# Patient Record
Sex: Male | Born: 1985 | Race: Black or African American | Hispanic: No | Marital: Married | Smoking: Never smoker
Health system: Southern US, Community
[De-identification: ages and names within clinical notes are randomized; demographics above are authoritative.]

## PROBLEM LIST (undated history)

## (undated) HISTORY — PX: HERNIA REPAIR: SHX51

## (undated) HISTORY — PX: BACK SURGERY: SHX140

---

## 2017-10-01 ENCOUNTER — Encounter (HOSPITAL_COMMUNITY): Payer: Self-pay | Admitting: Emergency Medicine

## 2017-10-01 ENCOUNTER — Emergency Department (HOSPITAL_COMMUNITY)
Admission: EM | Admit: 2017-10-01 | Discharge: 2017-10-01 | Disposition: A | Discharge status: Home / Self Care | Payer: Non-veteran care | Attending: Emergency Medicine | Admitting: Emergency Medicine

## 2017-10-01 DIAGNOSIS — M5442 Lumbago with sciatica, left side: Secondary | ICD-10-CM | POA: Diagnosis not present

## 2017-10-01 DIAGNOSIS — Z79899 Other long term (current) drug therapy: Secondary | ICD-10-CM | POA: Insufficient documentation

## 2017-10-01 DIAGNOSIS — G8929 Other chronic pain: Secondary | ICD-10-CM | POA: Insufficient documentation

## 2017-10-01 DIAGNOSIS — M545 Low back pain: Secondary | ICD-10-CM | POA: Diagnosis present

## 2017-10-01 DIAGNOSIS — M5432 Sciatica, left side: Secondary | ICD-10-CM

## 2017-10-01 MED ORDER — PREDNISONE 20 MG PO TABS: 20.0000 mg | ORAL_TABLET | Freq: Every day | ORAL | 0 refills | Status: AC

## 2017-10-01 MED ORDER — TRAMADOL HCL 50 MG PO TABS: 50.0000 mg | ORAL_TABLET | Freq: Four times a day (QID) | ORAL | 0 refills | Status: AC | PRN

## 2017-10-01 MED ORDER — KETOROLAC TROMETHAMINE 30 MG/ML IJ SOLN
60.0000 mg | Freq: Once | INTRAMUSCULAR | Status: DC
Start: 2017-10-01 — End: 2017-10-01
  Filled 2017-10-01: qty 2

## 2017-10-01 MED ORDER — CYCLOBENZAPRINE HCL 10 MG PO TABS: 10.0000 mg | ORAL_TABLET | Freq: Two times a day (BID) | ORAL | 0 refills | Status: DC | PRN

## 2017-10-01 NOTE — Discharge Instructions (Signed)
You can take Tylenol or Ibuprofen as directed for pain. You can alternate Tylenol and Ibuprofen every 4 hours. If you take Tylenol at 1pm, then you can take Ibuprofen at 5pm. Then you can take Tylenol again at 9pm.   Take Flexeril as prescribed. This medication will make you drowsy so do not drive or drink alcohol when taking it.  Take prednisone as directed.  He can take tramadol for severe breakthrough pain.  Follow-up with your primary care doctor next 2 to 4 days for further evaluation.  Return to the Emergency Department immediately for any worsening back pain, neck pain, difficulty walking, numbness/weaknss of your arms or legs, urinary or bowel accidents, fever or any other worsening or concerning symptoms.

## 2017-10-01 NOTE — ED Provider Notes (Signed)
[facility] EMERGENCY DEPARTMENT Provider Note   CSN: [id number] Arrival date & time: 10/01/17  1421     History   Chief Complaint Chief Complaint  Patient presents with  . Back Pain    HPI Terry Dunn is a 32 y.o. male with PMH/o discectomy (2012) who presents for evaluation of lower back pain.  Patient reports a history of chronic lower back pain to his back issues.  Patient reports that over the last 3 days the pain has been worsened.  He denies any preceding trauma, injury, fall.  He does state that prior to onset of the symptoms, he moved and lifted several boxes which he thinks exacerbates his pain.  Patient states he is taking over-the-counter Tylenol ibuprofen with minimal improvement.  He was seen in urgent care 1 day after symptoms began.  At that time, they gave him a shot of steroids and Toradol.  Patient reports that he was discharged home and instructed on supportive at home care measures.  He states he has been taking Robaxin with minimal improvement in symptoms.  Comes the ED today because he is getting have pain.  Patient states that he has had some tingling sensation in his bilateral lower extremities.  He states that this is his baseline and is something that he always has.  No new numbness/weakness.  He has been able to ambulate without any difficulty.  He does report some shooting pain down the posterior aspect of his left lower leg which makes it painful to move. Denies fevers, weight loss, numbness/weakness of upper and lower extremities, bowel/bladder incontinence, saddle anesthesia, history of IVDA.   The history is provided by the patient.    History reviewed. No pertinent past medical history.  There are no active problems to display for this patient.   Past Surgical History:  Procedure Laterality Date  . BACK SURGERY          Home Medications    Prior to Admission medications   Medication Sig Start Date End Date Taking?  Authorizing Provider  gabapentin (NEURONTIN) 300 MG capsule Take 300 mg by mouth 4 (four) times daily.   Yes [provider]  methocarbamol (ROBAXIN) 500 MG tablet Take 500 mg by mouth 2 (two) times daily.   Yes [provider]  cyclobenzaprine (FLEXERIL) 10 MG tablet Take 1 tablet (10 mg total) by mouth 2 (two) times daily as needed for muscle spasms. 10/01/17   Maxwell Caul, PA-C  predniSONE (DELTASONE) 20 MG tablet Take 1 tablet (20 mg total) by mouth daily for 4 days. 10/01/17 10/05/17  Maxwell Caul, PA-C  traMADol (ULTRAM) 50 MG tablet Take 1 tablet (50 mg total) by mouth every 6 (six) hours as needed for up to 2 days. 10/01/17 10/03/17  Maxwell Caul, PA-C    Family History No family history on file.  Social History Social History   Tobacco Use  . Smoking status: Not on file  Substance Use Topics  . Alcohol use: Not on file  . Drug use: Not on file     Allergies   Peanut-containing drug products   Review of Systems Review of Systems  Gastrointestinal: Negative for vomiting.  Musculoskeletal: Positive for back pain.  Neurological: Negative for weakness and numbness.     Physical Exam Updated Vital Signs BP (!) 148/74 (BP Location: Left Arm)   Pulse 76   Temp 99 F (37.2 C) (Oral)   Resp 16   SpO2 100%  Physical Exam  Constitutional: He appears well-developed and well-nourished.  HENT:  Head: Normocephalic and atraumatic.  Eyes: Conjunctivae and EOM are normal. Right eye exhibits no discharge. Left eye exhibits no discharge. No scleral icterus.  Neck:  Full flexion/extension and lateral movement of neck fully intact. No bony midline tenderness. No deformities or crepitus.   Cardiovascular:  Pulses:      Dorsalis pedis pulses are 2+ on the right side, and 2+ on the left side.  Pulmonary/Chest: Effort normal.  Musculoskeletal:       Thoracic back: He exhibits no tenderness.       Back:  No midline T-spine tenderness.  Diffuse  muscular tenderness overlying the lumbar region that extends into the midline.  No deformities or crepitus noted.  More tenderness noted to the left paraspinal muscles that extends out into the gluteal region.  Neurological: He is alert.  Follows commands, Moves all extremities  5/5 strength to BUE and RLE, initially limited movement left lower extremity secondary to patient's pain in his leg but after encouraging patient, he was able to have full strength in the leg without any signs of weakness. Sensation intact throughout all major nerve distributions Positive straight leg raise on left side.  Skin: Skin is warm and dry.  Psychiatric: He has a normal mood and affect. His speech is normal and behavior is normal.  Nursing note and vitals reviewed.    ED Treatments / Results  Labs (all labs ordered are listed, but only abnormal results are displayed) Labs Reviewed - No data to display  EKG None  Radiology No results found.  Procedures Procedures (including critical care time)  Medications Ordered in ED Medications - No data to display   Initial Impression / Assessment and Plan / ED Course  I have reviewed the triage vital signs and the nursing notes.  Pertinent labs & imaging results that were available during my care of the patient were reviewed by me and considered in my medical decision making (see chart for details).     32 year old male who presents for evaluation of lower back pain x3 days.  Reports pain started after he was moving heavy boxes.  Does have a history of discectomy at L5/S1.  No fevers, incontinence, numbness/weakness of arms or legs, difficulty in bleeding.  No red flags. Patient is afebrile, non-toxic appearing, sitting comfortably on examination table. Vital signs reviewed and stable.  Exam, he has diffuse tenderness over the entire lumbar region, most notably in the left paraspinal muscle.  Additionally, he has positive straight leg raise test on the left.   Patient initially was reluctant to move his leg secondary to pain but after encouraging him, he had full strength of left lower extremity with any difficulty.  Suspect that this is mechanical back pain with sciatica.  History/physical exam is not concerning for cauda equina, spinal abscess, discitis.  No indication for acute emergent imaging here in the ED.  We will plan to treat symptomatically.  Patient reviewed on PMP.  No recent narcotic prescriptions.  Offered patient analgesics here in the department.  He was driving himself home and instructed him that I cannot give him anything sedated.  Patient refused Toradol here in the ED.  Patient instructed to follow-up with his primary care for further evaluation. Patient had ample opportunity for questions and discussion. All patient's questions were answered with full understanding. Strict return precautions discussed. Patient expresses understanding and agreement to plan.   Final Clinical Impressions(s) / ED Diagnoses  Final diagnoses:  Sciatica of left side  Acute left-sided low back pain, with sciatica presence unspecified    ED Discharge Orders         Ordered    predniSONE (DELTASONE) 20 MG tablet  Daily     10/01/17 1749    cyclobenzaprine (FLEXERIL) 10 MG tablet  2 times daily PRN     10/01/17 1749    traMADol (ULTRAM) 50 MG tablet  Every 6 hours PRN     10/01/17 1749           Maxwell CaulLayden, Lindsey A, PA-C 10/01/17 2319    Tegeler, Canary Brimhristopher J, MD 10/01/17 [phone]

## 2017-10-01 NOTE — ED Triage Notes (Signed)
Pt arrives to for lower back pain with tingling into both lower legs. Pt [facility] and received pain medication with no relief. Pt reports back surgery in 2015.pt is ambulatory in triage.

## 2018-01-07 ENCOUNTER — Encounter: Payer: Self-pay | Admitting: Pulmonary Disease

## 2018-01-07 ENCOUNTER — Ambulatory Visit (INDEPENDENT_AMBULATORY_CARE_PROVIDER_SITE_OTHER): Payer: No Typology Code available for payment source | Admitting: Pulmonary Disease

## 2018-01-07 VITALS — BP 118/78 | HR 94 | Ht 72.0 in | Wt 252.0 lb

## 2018-01-07 DIAGNOSIS — G4733 Obstructive sleep apnea (adult) (pediatric): Secondary | ICD-10-CM

## 2018-01-07 NOTE — Progress Notes (Addendum)
Subjective:    Patient ID: Terry Dunn, male    DOB: 09/02/1985, 32 y.o.   MRN: [id number]  Chief complaint : patient with mild obstructive sleep apnea   Patient with a history of mild obstructive sleep apnea diagnosed on recent study He does have significant daytime symptoms-daytime sleepiness  Difficulty with sleep onset  He usually goes to bed between 11 and 2 AM, takes him about 30 minutes to 45 minutes to fall asleep, wakes up about 3-4 times during the night Final awakening about 630 He has gained some weight recently-works out a lot more, his weight is going up but is body fat composition is on not better He has at least 3 sleep studies The first 1 did reveal restless legs Second one was inconclusive The most recent one an in lab study did reveal mild obstructive sleep apnea  Denies a dry mouth in the morning, occasionally does have choking sensations, significant snoring history He has chronic back pain which contributed to the onset of his insomnia  His dad snores actively-not diagnosed with sleep apnea    Review of Systems  Constitutional: Negative for fever and unexpected weight change.  HENT: Negative for congestion, dental problem, ear pain, nosebleeds, postnasal drip, rhinorrhea, sinus pressure, sneezing, sore throat and trouble swallowing.   Eyes: Positive for redness and itching.  Respiratory: Negative for cough, chest tightness, shortness of breath and wheezing.   Cardiovascular: Negative for palpitations and leg swelling.  Gastrointestinal: Negative for nausea and vomiting.  Genitourinary: Negative for dysuria.  Musculoskeletal: Negative for joint swelling.  Skin: Negative for rash.  Allergic/Immunologic: Positive for environmental allergies. Negative for food allergies and immunocompromised state.  Neurological: Positive for headaches.  Hematological: Does not bruise/bleed easily.  Psychiatric/Behavioral: Negative for dysphoric mood. The patient is  nervous/anxious.    No past medical history on file. Social History   Socioeconomic History  . Marital status: Married    Spouse name: Not on file  . Number of children: Not on file  . Years of education: Not on file  . Highest education level: Not on file  Occupational History  . Not on file  Social Needs  . Financial resource strain: Not on file  . Food insecurity:    Worry: Not on file    Inability: Not on file  . Transportation needs:    Medical: Not on file    Non-medical: Not on file  Tobacco Use  . Smoking status: Not on file  Substance and Sexual Activity  . Alcohol use: Not on file  . Drug use: Not on file  . Sexual activity: Not on file  Lifestyle  . Physical activity:    Days per week: Not on file    Minutes per session: Not on file  . Stress: Not on file  Relationships  . Social connections:    Talks on phone: Not on file    Gets together: Not on file    Attends religious service: Not on file    Active member of club or organization: Not on file    Attends meetings of clubs or organizations: Not on file    Relationship status: Not on file  . Intimate partner violence:    Fear of current or ex partner: Not on file    Emotionally abused: Not on file    Physically abused: Not on file    Forced sexual activity: Not on file  Other Topics Concern  . Not on file  Social History  Narrative  . Not on file   No family history on file.  Vitals:   01/07/18 1433  BP: 118/78  Pulse: 94  SpO2: 98%       Objective:   Physical Exam  Constitutional: He appears well-developed and well-nourished. No distress.  HENT:  Head: Normocephalic and atraumatic.  Mallampati 2  Eyes: Pupils are equal, round, and reactive to light. EOM are normal. Right eye exhibits no discharge. Left eye exhibits no discharge.  Neck: Normal range of motion. Neck supple. No tracheal deviation present. No thyromegaly present.  Cardiovascular: Normal rate and regular rhythm.    Pulmonary/Chest: Effort normal and breath sounds normal. No stridor. No respiratory distress.  Abdominal: Soft. Bowel sounds are normal.  Musculoskeletal: Normal range of motion.     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  Sleep onset insomnia  Daytime sleepiness  Plan:  Patient will benefit from treatment with CPAP therapy  Pathophysiology of sleep disordered breathing discussed with the patient  Treatment options discussed with the patient  Will refer to DME company We will set the patient up with an auto titrating CPAP pressure settings of 5-15  Following initiation of treatment, if he still having significant problems with insomnia, may benefit from treatment of his insomnia with a short to medium acting hypnotic  We will continue to follow  We will see him back in the office in about 3 months Encouraged to call with any significant symptoms  Note was reviewed by AA Olalere.  MD

## 2018-01-07 NOTE — Patient Instructions (Signed)
Patient with mild obstructive sleep apnea with significant daytime sleepiness Sleep onset insomnia  Recent polysomnogram with mild obstructive sleep apnea History of restless legs  We will set you up with a CPAP  DME referral for auto titrating CPAP 5-15  I will see you back in the office in about 3 months  Call with significant concerns

## 2018-02-19 ENCOUNTER — Encounter (HOSPITAL_COMMUNITY): Payer: Self-pay | Admitting: Emergency Medicine

## 2018-02-19 ENCOUNTER — Emergency Department (HOSPITAL_COMMUNITY): Payer: Non-veteran care

## 2018-02-19 ENCOUNTER — Other Ambulatory Visit: Payer: Self-pay

## 2018-02-19 ENCOUNTER — Emergency Department (HOSPITAL_COMMUNITY)
Admission: EM | Admit: 2018-02-19 | Discharge: 2018-02-19 | Disposition: A | Discharge status: Home / Self Care | Payer: Non-veteran care | Attending: Emergency Medicine | Admitting: Emergency Medicine

## 2018-02-19 DIAGNOSIS — M5432 Sciatica, left side: Secondary | ICD-10-CM | POA: Diagnosis not present

## 2018-02-19 DIAGNOSIS — M549 Dorsalgia, unspecified: Secondary | ICD-10-CM | POA: Diagnosis present

## 2018-02-19 DIAGNOSIS — Z79899 Other long term (current) drug therapy: Secondary | ICD-10-CM | POA: Insufficient documentation

## 2018-02-19 LAB — BASIC METABOLIC PANEL
ANION GAP: 7 (ref 5–15)
BUN: 10 mg/dL (ref 6–20)
CHLORIDE: 106 mmol/L (ref 98–111)
CO2: 27 mmol/L (ref 22–32)
Calcium: 9.6 mg/dL (ref 8.9–10.3)
Creatinine, Ser: 1.16 mg/dL (ref 0.61–1.24)
GFR calc non Af Amer: >60 mL/min (ref >60)
Glucose, Bld: 100 mg/dL — ABNORMAL HIGH (ref 70–99)
POTASSIUM: 3.8 mmol/L (ref 3.5–5.1)
Sodium: 140 mmol/L (ref 135–145)

## 2018-02-19 LAB — CBC WITH DIFFERENTIAL/PLATELET
ABS IMMATURE GRANULOCYTES: 0.03 10*3/uL (ref 0.00–0.07)
BASOS ABS: 0 10*3/uL (ref 0.0–0.1)
Basophils Relative: 1 %
EOS PCT: 5 %
Eosinophils Absolute: 0.3 10*3/uL (ref 0.0–0.5)
HEMATOCRIT: 45.5 % (ref 39.0–52.0)
HEMOGLOBIN: 15.3 g/dL (ref 13.0–17.0)
Immature Granulocytes: 1 %
LYMPHS ABS: 2.6 10*3/uL (ref 0.7–4.0)
LYMPHS PCT: 44 %
MCH: 29.1 pg (ref 26.0–34.0)
MCHC: 33.6 g/dL (ref 30.0–36.0)
MCV: 86.7 fL (ref 80.0–100.0)
Monocytes Absolute: 0.5 10*3/uL (ref 0.1–1.0)
Monocytes Relative: 9 %
NEUTROS ABS: 2.3 10*3/uL (ref 1.7–7.7)
NRBC: 0 % (ref 0.0–0.2)
Neutrophils Relative %: 40 %
Platelets: 279 10*3/uL (ref 150–400)
RBC: 5.25 MIL/uL (ref 4.22–5.81)
RDW: 12.4 % (ref 11.5–15.5)
WBC: 5.8 10*3/uL (ref 4.0–10.5)

## 2018-02-19 MED ORDER — CYCLOBENZAPRINE HCL 10 MG PO TABS: 10.0000 mg | ORAL_TABLET | Freq: Two times a day (BID) | ORAL | 0 refills | Status: DC | PRN

## 2018-02-19 MED ORDER — TRAMADOL HCL 50 MG PO TABS: 50.0000 mg | ORAL_TABLET | Freq: Four times a day (QID) | ORAL | 0 refills | Status: DC | PRN

## 2018-02-19 MED ORDER — KETOROLAC TROMETHAMINE 30 MG/ML IJ SOLN
30.0000 mg | Freq: Once | INTRAMUSCULAR | Status: AC
  Administered 2018-02-19: 30 mg via INTRAVENOUS
  Filled 2018-02-19: qty 1

## 2018-02-19 MED ORDER — MORPHINE SULFATE (PF) 4 MG/ML IV SOLN
4.0000 mg | Freq: Once | INTRAVENOUS | Status: AC
  Administered 2018-02-19: 4 mg via INTRAVENOUS
  Filled 2018-02-19: qty 1

## 2018-02-19 MED ORDER — DIAZEPAM 5 MG/ML IJ SOLN
5.0000 mg | Freq: Once | INTRAMUSCULAR | Status: AC
  Administered 2018-02-19: 5 mg via INTRAVENOUS
  Filled 2018-02-19: qty 2

## 2018-02-19 NOTE — Discharge Instructions (Signed)
You have a pinched nerve   Take motrin for pain   Take flexeril for muscle spasms.   Take tramadol for severe pain   See spine doctor for follow up   Return to ER if you have worse back pain, leg pain, numbness, weakness

## 2018-02-19 NOTE — ED Provider Notes (Signed)
[facility] EMERGENCY DEPARTMENT Provider Note   CSN: [id number] Arrival date & time: 02/19/18  1907     History   Chief Complaint Chief Complaint  Patient presents with  . Sciatica    HPI Montague Tylutki is a 33 y.o. male history of sciatica here presenting with worsening back pain, left leg pain.  Patient states that he had a previous diagnosis of sciatica but had no imaging studies.  He has intermittent back pain the last several months.  Patient did come to the ED about 6 months ago and was diagnosed sciatica and finished a course of steroids.  States that steroids has minimally helped with symptoms.  He states that today, he had episode of back spasm is worse on the left side.  States that it radiates down his left leg. Denies any numbness or weakness or incontinence.  Denies any falls or trauma or injury.  The history is provided by the patient.    History reviewed. No pertinent past medical history.  There are no active problems to display for this patient.   Past Surgical History:  Procedure Laterality Date  . BACK SURGERY          Home Medications    Prior to Admission medications   Medication Sig Start Date End Date Taking? Authorizing Provider  gabapentin (NEURONTIN) 300 MG capsule Take 300 mg by mouth 4 (four) times daily.   Yes [provider]  methocarbamol (ROBAXIN) 500 MG tablet Take 500 mg by mouth 2 (two) times daily.   Yes [provider]  cyclobenzaprine (FLEXERIL) 10 MG tablet Take 1 tablet (10 mg total) by mouth 2 (two) times daily as needed for muscle spasms. Patient not taking: Reported on 02/19/2018 10/01/17   Maxwell Caul, PA-C    Family History No family history on file.  Social History Social History   Tobacco Use  . Smoking status: Never Smoker  . Smokeless tobacco: Never Used  Substance Use Topics  . Alcohol use: Never    Frequency: Never  . Drug use: Never     Allergies   Peanut-containing  drug products   Review of Systems Review of Systems  Musculoskeletal: Positive for back pain.  All other systems reviewed and are negative.    Physical Exam Updated Vital Signs BP (!) 129/107   Pulse 85   Temp 98.3 F (36.8 C) (Oral)   Resp 17   Ht 6' (1.829 m)   Wt 112.9 kg   SpO2 99%   BMI 33.77 kg/m   Physical Exam Vitals signs and nursing note reviewed.  Constitutional:      Comments: Uncomfortable   HENT:     Head: Normocephalic.     Nose: Nose normal.     Mouth/Throat:     Mouth: Mucous membranes are moist.  Eyes:     Extraocular Movements: Extraocular movements intact.     Pupils: Pupils are equal, round, and reactive to light.  Neck:     Musculoskeletal: Normal range of motion.  Cardiovascular:     Rate and Rhythm: Normal rate.  Pulmonary:     Effort: Pulmonary effort is normal.     Breath sounds: Normal breath sounds.  Abdominal:     General: Abdomen is flat.     Palpations: Abdomen is soft.  Musculoskeletal:     Comments: + L paralumbar tenderness. + straight leg raise L side, no saddle anesthesia. Nl reflexes bilaterally   Skin:    General: Skin  is warm.  Neurological:     General: No focal deficit present.     Mental Status: He is alert.  Psychiatric:        Mood and Affect: Mood normal.      ED Treatments / Results  Labs (all labs ordered are listed, but only abnormal results are displayed) Labs Reviewed  BASIC METABOLIC PANEL - Abnormal; Notable for the following components:      Result Value   Glucose, Bld 100 (*)    All other components within normal limits  CBC WITH DIFFERENTIAL/PLATELET    EKG None  Radiology Ct Lumbar Spine Wo Contrast  Result Date: 02/19/2018 CLINICAL DATA:  Back pain and bilateral lower extremity numbness. EXAM: CT LUMBAR SPINE WITHOUT CONTRAST TECHNIQUE: Multidetector CT imaging of the lumbar spine was performed without intravenous contrast administration. Multiplanar CT image reconstructions were also  generated. COMPARISON:  None. FINDINGS: Segmentation: 5 lumbar type vertebrae. Alignment: Normal. Vertebrae: No acute fracture or focal pathologic process. L5-S1 spacer. Paraspinal and other soft tissues: Negative. Disc levels: The disc spaces above L5 are normal. L5-S1: Postsurgical change with intervertebral spacer. There is moderate right and mild left neural foraminal stenosis due to endplate spurring. Spinal canal is largely obscured by streak artifact. IMPRESSION: 1. No acute fracture or static subluxation of the lumbar spine. 2. 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 Electronically Signed   By: Deatra Robinson M.D.   On: 02/19/2018 22:28    Procedures Procedures (including critical care time)  Medications Ordered in ED Medications  morphine 4 MG/ML injection 4 mg (4 mg Intravenous Given 02/19/18 2111)  diazepam (VALIUM) injection 5 mg (5 mg Intravenous Given 02/19/18 2125)  ketorolac (TORADOL) 30 MG/ML injection 30 mg (30 mg Intravenous Given 02/19/18 2118)     Initial Impression / Assessment and Plan / ED Course  I have reviewed the triage vital signs and the nursing notes.  Pertinent labs & imaging results that were available during my care of the patient were reviewed by me and considered in my medical decision making (see chart for details).    Meredith Hoese is a 33 y.o. male here with back pain. Likely sciatica from disc protrusion. States that steroids hasn't help much in the past and has no imaging studies for this. Will get CT lumbar spine. No need for MRI as he is neurovascularly intact. Will give pain meds, muscle relaxants, NSAIDS.   11:19 PM CT showed L5-S1 stenosis. Pain improved. Will dc home with tramadol, flexeril. Will refer to spine for follow up. He states that steroids hasn't help in the past so will hold off on steroids.   Final Clinical Impressions(s) / ED Diagnoses   Final diagnoses:  None    ED Discharge Orders     None       Charlynne Pander, MD 02/19/18 2320

## 2018-02-19 NOTE — ED Triage Notes (Signed)
Pt reports left lower back pain and bilateral leg numbness that started this morning after having a back spasm. Pt reports feels like sciatica. Pt ambulatory, reports painful, tingling sensation when walking.

## 2018-04-02 ENCOUNTER — Ambulatory Visit (INDEPENDENT_AMBULATORY_CARE_PROVIDER_SITE_OTHER): Payer: No Typology Code available for payment source | Admitting: Pulmonary Disease

## 2018-04-02 ENCOUNTER — Encounter: Payer: Self-pay | Admitting: Pulmonary Disease

## 2018-04-02 VITALS — BP 120/82 | HR 76 | Ht 72.0 in | Wt 247.8 lb

## 2018-04-02 DIAGNOSIS — G4733 Obstructive sleep apnea (adult) (pediatric): Secondary | ICD-10-CM

## 2018-04-02 DIAGNOSIS — Z9989 Dependence on other enabling machines and devices: Secondary | ICD-10-CM | POA: Diagnosis not present

## 2018-04-02 NOTE — Patient Instructions (Signed)
Obstructive sleep apnea  Encourage you to give CPAP another effort, try and narrow down what may be causing the intolerance  An oral device may be an option if you still cannot tolerate CPAP despite trying to get used to it  I will see you back in the office in about 6 weeks

## 2018-04-02 NOTE — Progress Notes (Signed)
Subjective:    Patient ID: Terry Dunn, male    DOB: 28-Feb-1985, 33 y.o.   MRN: [id number]  Chief complaint : patient with mild obstructive sleep apnea   Patient with a history of mild obstructive sleep apnea diagnosed on recent study He does have significant daytime symptoms-daytime sleepiness  Difficulty with sleep onset Has been using melatonin which is helping  History He usually goes to bed between 11 and 2 AM, takes him about 30 minutes to 45 minutes to fall asleep, wakes up about 3-4 times during the night Final awakening about 630 He has gained some weight recently-works out a lot more, his weight is going up but is body fat composition is on not better He has at least 3 sleep studies The first 1 did reveal restless legs Second one was inconclusive The most recent one an in lab study did reveal mild obstructive sleep apnea  Denies a dry mouth in the morning, occasionally does have choking sensations, significant snoring history He has chronic back pain which contributed to the onset of his insomnia  His dad snores actively-not diagnosed with sleep apnea    Review of Systems  Constitutional: Negative for fever and unexpected weight change.  HENT: Negative for congestion, dental problem, ear pain, nosebleeds, postnasal drip, rhinorrhea, sinus pressure, sneezing, sore throat and trouble swallowing.   Eyes: Negative.   Respiratory: Negative for cough, chest tightness, shortness of breath and wheezing.   Cardiovascular: Negative for palpitations and leg swelling.  Gastrointestinal: Negative for nausea and vomiting.  Genitourinary: Negative.   Skin: Negative for rash.  Allergic/Immunologic: Negative.   Neurological: Negative.   Hematological: Negative.   Psychiatric/Behavioral: Negative.    No past medical history on file. Social History   Socioeconomic History  . Marital status: Married    Spouse name: Not on file  . Number of children: Not on file  . Years of  education: Not on file  . Highest education level: Not on file  Occupational History  . Not on file  Social Needs  . Financial resource strain: Not on file  . Food insecurity:    Worry: Not on file    Inability: Not on file  . Transportation needs:    Medical: Not on file    Non-medical: Not on file  Tobacco Use  . Smoking status: Never Smoker  . Smokeless tobacco: Never Used  Substance and Sexual Activity  . Alcohol use: Never    Frequency: Never  . Drug use: Never  . Sexual activity: Not on file  Lifestyle  . Physical activity:    Days per week: Not on file    Minutes per session: Not on file  . Stress: Not on file  Relationships  . Social connections:    Talks on phone: Not on file    Gets together: Not on file    Attends religious service: Not on file    Active member of club or organization: Not on file    Attends meetings of clubs or organizations: Not on file    Relationship status: Not on file  . Intimate partner violence:    Fear of current or ex partner: Not on file    Emotionally abused: Not on file    Physically abused: Not on file    Forced sexual activity: Not on file  Other Topics Concern  . Not on file  Social History Narrative  . Not on file   No family history on file.  There  were no vitals filed for this visit.     Objective:   Physical Exam Constitutional:      General: He is not in acute distress.    Appearance: He is well-developed. He is not ill-appearing.  HENT:     Head: Normocephalic and atraumatic.  Eyes:     General:        Right eye: No discharge.        Left eye: No discharge.     Pupils: Pupils are equal, round, and reactive to light.  Neck:     Musculoskeletal: Normal range of motion and neck supple.     Thyroid: No thyromegaly.     Trachea: No tracheal deviation.  Cardiovascular:     Rate and Rhythm: Normal rate and regular rhythm.  Pulmonary:     Effort: Pulmonary effort is normal. No respiratory distress.      Breath sounds: Normal breath sounds. No stridor.  Abdominal:     General: Bowel sounds are normal.     Palpations: Abdomen is soft.      Most recent sleep study did reveal an AHI of 10 Started on auto titrating CPAP Results of the Epworth flowsheet 01/07/2018  Sitting and reading 2  Watching TV 1  Sitting, inactive in a public place (e.g. 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 -Melatonin seems to be helping the insomnia  Daytime sleepiness   Plan:  Patient will benefit from treatment with CPAP therapy -Did discuss problems these he is having with CPAP at present -He feels the pressure feels too much when he puts the mask on, sometimes feel he is doing a lot of work to breathe with the mask on  Pathophysiology of sleep disordered breathing discussed with the patient  Treatment options discussed with the patient -The possibility of intolerance of CPAP discussed -May require evaluation by a dentist for an oral device  If he is able to tolerate CPAP a little bit better He may benefit from a hypnotic agent to help with sleep onset and sleep maintenance insomnia  We will see him back in the office in about 6 weeks Encouraged to call with any significant symptoms

## 2018-04-08 ENCOUNTER — Encounter (HOSPITAL_COMMUNITY): Payer: Self-pay

## 2018-04-08 ENCOUNTER — Emergency Department (HOSPITAL_COMMUNITY)
Admission: EM | Admit: 2018-04-08 | Discharge: 2018-04-09 | Disposition: A | Discharge status: Home / Self Care | Payer: Non-veteran care | Attending: Emergency Medicine | Admitting: Emergency Medicine

## 2018-04-08 ENCOUNTER — Other Ambulatory Visit: Payer: Self-pay

## 2018-04-08 DIAGNOSIS — M549 Dorsalgia, unspecified: Secondary | ICD-10-CM | POA: Diagnosis not present

## 2018-04-08 DIAGNOSIS — Z5321 Procedure and treatment not carried out due to patient leaving prior to being seen by health care provider: Secondary | ICD-10-CM | POA: Diagnosis not present

## 2018-04-08 NOTE — ED Triage Notes (Signed)
Pt here with hx disc disease and stood up today and hard sharp right sided back pain.  A&Ox4. No other symptoms.

## 2018-04-09 NOTE — ED Notes (Signed)
Pt called for, no answer

## 2018-04-09 NOTE — ED Notes (Signed)
Called for room  No answer  

## 2018-05-14 ENCOUNTER — Ambulatory Visit: Payer: No Typology Code available for payment source | Admitting: Nurse Practitioner

## 2018-05-14 ENCOUNTER — Ambulatory Visit: Payer: No Typology Code available for payment source | Admitting: Pulmonary Disease

## 2018-09-01 ENCOUNTER — Other Ambulatory Visit: Payer: Self-pay

## 2018-09-01 ENCOUNTER — Ambulatory Visit: Payer: No Typology Code available for payment source | Admitting: Nurse Practitioner

## 2018-09-01 NOTE — Progress Notes (Deleted)
 @  Patient ID: Terry Dunn, male    DOB: 09-15-1985, 33 y.o.   MRN: [id number]  No chief complaint on file.   Referring provider: [facility]  HPI  33 year old male with OSA who is followed by Dr. Ander Slade  Tests:  Most recent sleep study showed AHI 10  Results of the Epworth flowsheet 01/07/2018  Sitting and reading 2  Watching TV 1  Sitting, inactive in a public place (e.g. a theatre or a meeting) 1  As a passenger in a car for an hour without a break 1  Lying down to rest in the afternoon when circumstances permit 2  Sitting and talking to someone 0  Sitting quietly after a lunch without alcohol 1  In a car, while stopped for a few minutes in traffic 0  Total score 8    OV 09/01/18 - follow up    Allergies  Allergen Reactions  . Peanut-Containing Drug Products Anaphylaxis    ALL TREE NUTS     There is no immunization history on file for this patient.  No past medical history on file.  Tobacco History: Social History   Tobacco Use  Smoking Status Never Smoker  Smokeless Tobacco Never Used   Counseling given: Not Answered   Outpatient Encounter Medications as of 09/01/2018  Medication Sig  . baclofen (LIORESAL) 20 MG tablet Take 20 mg by mouth 3 (three) times daily.  Marland Kitchen gabapentin (NEURONTIN) 300 MG capsule Take 300 mg by mouth 4 (four) times daily.  . methocarbamol (ROBAXIN) 500 MG tablet Take 500 mg by mouth 2 (two) times daily.  . traMADol (ULTRAM) 50 MG tablet Take 1 tablet (50 mg total) by mouth every 6 (six) hours as needed.   No facility-administered encounter medications on file as of 09/01/2018.      Review of Systems  Review of Systems     Physical Exam  There were no vitals taken for this visit.  Wt Readings from Last 5 Encounters:  04/02/18 247 lb 12.8 oz (112.4 kg)  02/19/18 249 lb (112.9 kg)  01/07/18 252 lb (114.3 kg)     Physical Exam   Lab Results:  CBC    Component Value Date/Time   WBC 5.8  02/19/2018 2113   RBC 5.25 02/19/2018 2113   HGB 15.3 02/19/2018 2113   HCT 45.5 02/19/2018 2113   PLT 279 02/19/2018 2113   MCV 86.7 02/19/2018 2113   MCH 29.1 02/19/2018 2113   MCHC 33.6 02/19/2018 2113   RDW 12.4 02/19/2018 2113   LYMPHSABS 2.6 02/19/2018 2113   MONOABS 0.5 02/19/2018 2113   EOSABS 0.3 02/19/2018 2113   BASOSABS 0.0 02/19/2018 2113    BMET    Component Value Date/Time   NA 140 02/19/2018 2113   K 3.8 02/19/2018 2113   CL 106 02/19/2018 2113   CO2 27 02/19/2018 2113   GLUCOSE 100 (H) 02/19/2018 2113   BUN 10 02/19/2018 2113   CREATININE 1.16 02/19/2018 2113   CALCIUM 9.6 02/19/2018 2113   GFRNONAA >60 02/19/2018 2113   GFRAA >60 02/19/2018 2113    BNP No results found for: BNP  ProBNP No results found for: PROBNP  Imaging: No results found.   Assessment & Plan:   No problem-specific Assessment & Plan notes found for this encounter.     Fenton Foy, NP 09/01/2018

## 2019-03-22 ENCOUNTER — Encounter (HOSPITAL_COMMUNITY): Payer: Self-pay | Admitting: Radiology

## 2019-03-22 ENCOUNTER — Emergency Department (HOSPITAL_COMMUNITY)
Admission: EM | Admit: 2019-03-22 | Discharge: 2019-03-22 | Disposition: A | Discharge status: Home / Self Care | Payer: No Typology Code available for payment source | Attending: Emergency Medicine | Admitting: Emergency Medicine

## 2019-03-22 ENCOUNTER — Emergency Department (HOSPITAL_COMMUNITY): Payer: No Typology Code available for payment source

## 2019-03-22 ENCOUNTER — Other Ambulatory Visit: Payer: Self-pay

## 2019-03-22 DIAGNOSIS — R197 Diarrhea, unspecified: Secondary | ICD-10-CM | POA: Diagnosis not present

## 2019-03-22 DIAGNOSIS — Z79899 Other long term (current) drug therapy: Secondary | ICD-10-CM | POA: Insufficient documentation

## 2019-03-22 DIAGNOSIS — R1033 Periumbilical pain: Secondary | ICD-10-CM | POA: Insufficient documentation

## 2019-03-22 DIAGNOSIS — Z91010 Allergy to peanuts: Secondary | ICD-10-CM | POA: Diagnosis not present

## 2019-03-22 DIAGNOSIS — R109 Unspecified abdominal pain: Secondary | ICD-10-CM

## 2019-03-22 DIAGNOSIS — R1030 Lower abdominal pain, unspecified: Secondary | ICD-10-CM | POA: Insufficient documentation

## 2019-03-22 LAB — COMPREHENSIVE METABOLIC PANEL
ALT: 33 U/L (ref 0–44)
AST: 23 U/L (ref 15–41)
Albumin: 4 g/dL (ref 3.5–5.0)
Alkaline Phosphatase: 37 U/L — ABNORMAL LOW (ref 38–126)
Anion gap: 9 (ref 5–15)
BUN: 11 mg/dL (ref 6–20)
CO2: 25 mmol/L (ref 22–32)
Calcium: 9.3 mg/dL (ref 8.9–10.3)
Chloride: 105 mmol/L (ref 98–111)
Creatinine, Ser: 1.09 mg/dL (ref 0.61–1.24)
GFR calc Af Amer: >60 mL/min (ref >60)
GFR calc non Af Amer: >60 mL/min (ref >60)
Glucose, Bld: 77 mg/dL (ref 70–99)
Potassium: 3.5 mmol/L (ref 3.5–5.1)
Sodium: 139 mmol/L (ref 135–145)
Total Bilirubin: 1.9 mg/dL — ABNORMAL HIGH (ref 0.3–1.2)
Total Protein: 6.8 g/dL (ref 6.5–8.1)

## 2019-03-22 LAB — CBC WITH DIFFERENTIAL/PLATELET
Abs Immature Granulocytes: 0.03 10*3/uL (ref 0.00–0.07)
Basophils Absolute: 0.1 10*3/uL (ref 0.0–0.1)
Basophils Relative: 1 %
Eosinophils Absolute: 0.3 10*3/uL (ref 0.0–0.5)
Eosinophils Relative: 4 %
HCT: 46.1 % (ref 39.0–52.0)
Hemoglobin: 15.7 g/dL (ref 13.0–17.0)
Immature Granulocytes: 0 %
Lymphocytes Relative: 50 %
Lymphs Abs: 3.5 10*3/uL (ref 0.7–4.0)
MCH: 30.7 pg (ref 26.0–34.0)
MCHC: 34.1 g/dL (ref 30.0–36.0)
MCV: 90.2 fL (ref 80.0–100.0)
Monocytes Absolute: 0.6 10*3/uL (ref 0.1–1.0)
Monocytes Relative: 9 %
Neutro Abs: 2.5 10*3/uL (ref 1.7–7.7)
Neutrophils Relative %: 36 %
Platelets: 300 10*3/uL (ref 150–400)
RBC: 5.11 MIL/uL (ref 4.22–5.81)
RDW: 13.4 % (ref 11.5–15.5)
WBC: 6.9 10*3/uL (ref 4.0–10.5)
nRBC: 0 % (ref 0.0–0.2)

## 2019-03-22 LAB — URINALYSIS, ROUTINE W REFLEX MICROSCOPIC
Bilirubin Urine: NEGATIVE
Glucose, UA: NEGATIVE mg/dL
Hgb urine dipstick: NEGATIVE
Ketones, ur: NEGATIVE mg/dL
Leukocytes,Ua: NEGATIVE
Nitrite: NEGATIVE
Protein, ur: NEGATIVE mg/dL
Specific Gravity, Urine: 1.01 (ref 1.005–1.030)
pH: 7 (ref 5.0–8.0)

## 2019-03-22 LAB — LIPASE, BLOOD: Lipase: 32 U/L (ref 11–51)

## 2019-03-22 MED ORDER — IOHEXOL 300 MG/ML  SOLN
100.0000 mL | Freq: Once | INTRAMUSCULAR | Status: AC | PRN
  Administered 2019-03-22: 100 mL via INTRAVENOUS

## 2019-03-22 MED ORDER — CIPROFLOXACIN HCL 500 MG PO TABS: 500.0000 mg | ORAL_TABLET | Freq: Two times a day (BID) | ORAL | 0 refills | Status: AC

## 2019-03-22 MED ORDER — MORPHINE SULFATE (PF) 4 MG/ML IV SOLN
4.0000 mg | Freq: Once | INTRAVENOUS | Status: AC
Start: 2019-03-22 — End: 2019-03-22
  Administered 2019-03-22: 4 mg via INTRAVENOUS
  Filled 2019-03-22: qty 1

## 2019-03-22 MED ORDER — SODIUM CHLORIDE 0.9 % IV BOLUS
1000.0000 mL | Freq: Once | INTRAVENOUS | Status: AC
  Administered 2019-03-22: 1000 mL via INTRAVENOUS

## 2019-03-22 MED ORDER — METRONIDAZOLE 500 MG PO TABS: 500.0000 mg | ORAL_TABLET | Freq: Three times a day (TID) | ORAL | 0 refills | Status: AC

## 2019-03-22 MED ORDER — MORPHINE SULFATE (PF) 4 MG/ML IV SOLN
4.0000 mg | Freq: Once | INTRAVENOUS | Status: AC
  Administered 2019-03-22: 14:00:00 4 mg via INTRAVENOUS
  Filled 2019-03-22: qty 1

## 2019-03-22 MED ORDER — ONDANSETRON HCL 4 MG/2ML IJ SOLN
4.0000 mg | Freq: Once | INTRAMUSCULAR | Status: AC
  Administered 2019-03-22: 4 mg via INTRAVENOUS
  Filled 2019-03-22: qty 2

## 2019-03-22 NOTE — ED Provider Notes (Signed)
[facility] DEPART[facility] Provider Note   CSN: [id number] Arrival date & time: 03/22/19  1256     History Chief Complaint  Patient presents with   Abdominal Pain    Terry Dunn is a 34 y.o. male otherwise healthy no daily medication delays presents today for abdominal pain that began at 4 PM yesterday.  He was making chicken wings when his pain began, he denies eating prior to onset of pain.  He describes a cramping sharp pain periumbilical moderate intensity constant since onset, pain has begun to radiate to his lower abdomen and he points to his right lower abdomen.  He attempted Pepto-Bismol x1 without relief of his pain.  Pain is worsened with palpation of the abdomen.  Associated symptom of 3 episodes of nonbloody, nonwatery diarrhea, normal color per patient.  He denies fever/chills, nausea/vomiting, headache, chest pain/shortness of breath, cough or recent illnesses, he denies any dysuria/hematuria or testicular pain/swelling patient denies any other concerns today.  HPI     No past medical history on file.  There are no problems to display for this patient.   Past Surgical History:  Procedure Laterality Date   BACK SURGERY         No family history on file.  Social History   Tobacco Use   Smoking status: Never Smoker   Smokeless tobacco: Never Used  Substance Use Topics   Alcohol use: Never   Drug use: Never    Home Medications Prior to Admission medications   Medication Sig Start Date End Date Taking? Authorizing Provider  baclofen (LIORESAL) 20 MG tablet Take 20 mg by mouth 3 (three) times daily.    [provider]  gabapentin (NEURONTIN) 300 MG capsule Take 300 mg by mouth 4 (four) times daily.    [provider]  methocarbamol (ROBAXIN) 500 MG tablet Take 500 mg by mouth 2 (two) times daily.    [provider]  traMADol (ULTRAM) 50 MG tablet Take 1 tablet (50 mg total) by mouth every 6 (six)  hours as needed. 02/19/18   Drenda Freeze, MD    Allergies    Peanut-containing drug products  Review of Systems   Review of Systems Ten systems are reviewed and are negative for acute change except as noted in the HPI  Physical Exam Updated Vital Signs BP 118/85    Pulse 99    Temp 98 F (36.7 C) (Oral)    Resp 18    SpO2 100%   Physical Exam Constitutional:      General: He is not in acute distress.    Appearance: Normal appearance. He is well-developed. He is not ill-appearing or diaphoretic.  HENT:     Head: Normocephalic and atraumatic.     Right Ear: External ear normal.     Left Ear: External ear normal.     Nose: Nose normal.  Eyes:     General: Vision grossly intact. Gaze aligned appropriately.     Pupils: Pupils are equal, round, and reactive to light.  Neck:     Trachea: Trachea and phonation normal. No tracheal deviation.  Pulmonary:     Effort: Pulmonary effort is normal. No respiratory distress.  Abdominal:     General: There is no distension.     Palpations: Abdomen is soft.     Tenderness: There is abdominal tenderness in the right lower quadrant, periumbilical area, suprapubic area and left lower quadrant. There is no guarding or rebound. Positive signs include McBurney's  sign. Negative signs include Murphy's sign.  Genitourinary:    Comments: GU exam deferred by patient Musculoskeletal:        General: Normal range of motion.     Cervical back: Normal range of motion.  Skin:    General: Skin is warm and dry.  Neurological:     Mental Status: He is alert.     GCS: GCS eye subscore is 4. GCS verbal subscore is 5. GCS motor subscore is 6.     Comments: Speech is clear and goal oriented, follows commands Major Cranial nerves without deficit, no facial droop Moves extremities without ataxia, coordination intact  Psychiatric:        Behavior: Behavior normal.     ED Results / Procedures / Treatments   Labs (all labs ordered are listed, but only  abnormal results are displayed) Labs Reviewed  LIPASE, BLOOD  COMPREHENSIVE METABOLIC PANEL  CBC  URINALYSIS, ROUTINE W REFLEX MICROSCOPIC    EKG None  Radiology No results found.  Procedures Procedures (including critical care time)  Medications Ordered in ED Medications - No data to display  ED Course  I have reviewed the triage vital signs and the nursing notes.  Pertinent labs & imaging results that were available during my care of the patient were reviewed by me and considered in my medical decision making (see chart for details).    MDM Rules/Calculators/A&P                     35 year old otherwise healthy male presents today for ongoing abdominal pain starting 4 PM yesterday.  He is nontoxic-appearing and in no acute distress.  Vital signs stable on room air.  Pain began periumbilical but now he describes as wrapping around to his lower abdomen including the right lower quadrant.  He is tender on exam diffusely to the mid and lower abdomen, positive McBurney's point sign.  Blood work is in process from triage, concern for possible appendicitis at this time.  Will obtain CT abdomen pelvis and give IV fluids, pain and nausea medication. - CBC, lipase and urinalysis within normal limits.  CMP with bilirubin of 1.9, alk phos 37 otherwise within normal limits.  CT abdomen pelvis pending.  Care handoff given to [facility] PA-C at shift change.  Plan of care is to follow-up on CT abdomen pelvis, reassess, disposition per oncoming team.   Note: Portions of this report may have been transcribed using voice recognition software. Every effort was made to ensure accuracy; however, inadvertent computerized transcription errors may still be present. Final Clinical Impression(s) / ED Diagnoses Final diagnoses:  None    Rx / DC Orders ED Discharge Orders    None       Gari Crown 03/22/19 1517    Charlesetta Shanks, MD 03/30/19 [phone]

## 2019-03-22 NOTE — Discharge Instructions (Signed)
You have been seen today for abdominal pain. Please read and follow all provided instructions. Return to the emergency room for worsening condition or new concerning symptoms.    1. Medications:  Prescription to your pharmacy for 2 antibiotics.  These antibiotics are used to treat abdominal infections.  As we discussed your blood work was reassuring however your CT scan showed possible colitis.  To be safe we want to cover you with antibiotics.  Please take them as prescribed. -Ciprofloxacin is an antibiotic that can cause diarrhea.  Most people recommend taking this with a probiotic or yogurt to decrease the chance of diarrhea. -Metronidazole: This antibiotic cannot be mixed with alcohol.  Do not drink any alcohol while taking this as it will make you have forceful vomiting.  Continue usual home medications Take medications as prescribed. Please review all of the medicines and only take them if you do not have an allergy to them.   2. Treatment: rest, drink plenty of fluids  3. Follow Up:  Please follow up with primary care provider by scheduling an appointment as soon as possible for a visit    It is also a possibility that you have an allergic reaction to any of the medicines that you have been prescribed - Everybody reacts differently to medications and while MOST people have no trouble with most medicines, you may have a reaction such as nausea, vomiting, rash, swelling, shortness of breath. If this is the case, please stop taking the medicine immediately and contact your physician.  ?

## 2019-03-22 NOTE — ED Provider Notes (Signed)
Care assumed from B. Morelli PA-C at shift change pending CT A/P.  See his note for full H&P.   Briefly this is a 33 year old male presenting with abdominal swelling and tenderness x1 day.  CBC and lipase are normal.  CMP with no severe electrolyte derangement, no insufficiency, there is slight elevation in total bili at 1.9.  UA is negative for infection.  She has been given morphine and Zofran for pain and nausea.      Physical Exam  BP 123/85   Pulse 72   Temp 98 F (36.7 C) (Oral)   Resp 18   SpO2 100%   Physical Exam  PE: Constitutional: well-developed, well-nourished, no apparent distress HENT: normocephalic, atraumatic. no cervical adenopathy Cardiovascular: normal rate and rhythm, distal pulses intact Pulmonary/Chest: effort normal; breath sounds clear and equal bilaterally; no wheezes or rales Abdominal: soft.  Normoactive bowel sounds.  Patient has mild abdominal tenderness in the left upper quadrant on exam.  No rebound or guarding. Musculoskeletal: full ROM, no edema Neurological: alert with goal directed thinking Skin: warm and dry, no rash, no diaphoresis Psychiatric: normal mood and affect, normal behavior    ED Course/Procedures     Results for orders placed or performed during the hospital encounter of 03/22/19 (from the past 24 hour(s))  Lipase, blood     Status: None   Collection Time: 03/22/19  1:10 PM  Result Value Ref Range   Lipase 32 11 - 51 U/L  Comprehensive metabolic panel     Status: Abnormal   Collection Time: 03/22/19  1:10 PM  Result Value Ref Range   Sodium 139 135 - 145 mmol/L   Potassium 3.5 3.5 - 5.1 mmol/L   Chloride 105 98 - 111 mmol/L   CO2 25 22 - 32 mmol/L   Glucose, Bld 77 70 - 99 mg/dL   BUN 11 6 - 20 mg/dL   Creatinine, Ser 3.20 0.61 - 1.24 mg/dL   Calcium 9.3 8.9 - 23.3 mg/dL   Total Protein 6.8 6.5 - 8.1 g/dL   Albumin 4.0 3.5 - 5.0 g/dL   AST 23 15 - 41 U/L   ALT 33 0 - 44 U/L   Alkaline Phosphatase 37 (L) 38 - 126 U/L    Total Bilirubin 1.9 (H) 0.3 - 1.2 mg/dL   GFR calc non Af Amer >60 >60 mL/min   GFR calc Af Amer >60 >60 mL/min   Anion gap 9 5 - 15  CBC with Differential     Status: None   Collection Time: 03/22/19  1:10 PM  Result Value Ref Range   WBC 6.9 4.0 - 10.5 K/uL   RBC 5.11 4.22 - 5.81 MIL/uL   Hemoglobin 15.7 13.0 - 17.0 g/dL   HCT 43.5 68.6 - 16.8 %   MCV 90.2 80.0 - 100.0 fL   MCH 30.7 26.0 - 34.0 pg   MCHC 34.1 30.0 - 36.0 g/dL   RDW 37.2 90.2 - 11.1 %   Platelets 300 150 - 400 K/uL   nRBC 0.0 0.0 - 0.2 %   Neutrophils Relative % 36 %   Neutro Abs 2.5 1.7 - 7.7 K/uL   Lymphocytes Relative 50 %   Lymphs Abs 3.5 0.7 - 4.0 K/uL   Monocytes Relative 9 %   Monocytes Absolute 0.6 0.1 - 1.0 K/uL   Eosinophils Relative 4 %   Eosinophils Absolute 0.3 0.0 - 0.5 K/uL   Basophils Relative 1 %   Basophils Absolute 0.1 0.0 - 0.1  K/uL   Immature Granulocytes 0 %   Abs Immature Granulocytes 0.03 0.00 - 0.07 K/uL  Urinalysis, Routine w reflex microscopic     Status: None   Collection Time: 03/22/19  1:30 PM  Result Value Ref Range   Color, Urine YELLOW YELLOW   APPearance CLEAR CLEAR   Specific Gravity, Urine 1.010 1.005 - 1.030   pH 7.0 5.0 - 8.0   Glucose, UA NEGATIVE NEGATIVE mg/dL   Hgb urine dipstick NEGATIVE NEGATIVE   Bilirubin Urine NEGATIVE NEGATIVE   Ketones, ur NEGATIVE NEGATIVE mg/dL   Protein, ur NEGATIVE NEGATIVE mg/dL   Nitrite NEGATIVE NEGATIVE   Leukocytes,Ua NEGATIVE NEGATIVE   CT ABDOMEN AND PELVIS WITH CONTRAST    TECHNIQUE:  Multidetector CT imaging of the abdomen and pelvis was performed  using the standard protocol following bolus administration of  intravenous contrast.    CONTRAST: OMNIPAQUE IOHEXOL 300 MG/ML SOLN    COMPARISON: None.    FINDINGS:  Lower chest: No acute abnormality.    Hepatobiliary: No focal liver abnormality is seen. No gallstones,  gallbladder wall thickening, or biliary dilatation.    Pancreas: Unremarkable.  No pancreatic ductal dilatation or  surrounding inflammatory changes.    Spleen: Normal in size without focal abnormality.    Adrenals/Urinary Tract: Adrenal glands are unremarkable. Kidneys are  normal, without renal calculi, focal lesion, or hydronephrosis.  Bladder is unremarkable.    Stomach/Bowel: Stomach is within normal limits. Appendix appears  normal. There is mild thickening of the ascending and transverse  colon which is collapsed.    Vascular/Lymphatic: No significant vascular findings are present. No  enlarged abdominal or pelvic lymph nodes.    Reproductive: Prostate is unremarkable.    Other: No abdominal wall hernia or abnormality. No abdominopelvic  ascites.    Musculoskeletal: Metallic density operative material is seen within  the L5-S1 intervertebral disc space. Associated streak artifact is  noted.    IMPRESSION:  1. Mild thickening of the ascending and transverse colon which is  collapsed. Very mild colitis cannot be excluded.  2. Normal appendix.  3. Postoperative changes within the lower lumbar spine.      Electronically Signed  By: Aram Candela M.D.  On: 03/22/2019 15:28     MDM  3:07 PM Patient received in and out pending CT abdomen pelvis.  See previous provider's note for contributing MDM.  3:58 PM CT abdomen pelvis shows mild thickening of the ascending and transverse colon.  The transverse colon appears collapsed.  The appendix appears normal.  Patient's labs are reassuring with no leukocytosis, and he is afebrile as well.  On my exam patient still had pain in the left upper quadrant with voluntary guarding.  A second dose of pain medication was given.  I reassessed patient after he had the pain medicine and he is very well-appearing.  He now has very mild pain with palpation of the left upper quadrant, no guarding.  Patient is tolerating p.o. intake and has eaten a sandwich bag and been drinking orange juice while here in  the emergency department.  Case discussed with ED attending Dr. Stevie Kern who personally evaluated the patient.  We feel that patient is stable to be discharged home with antibiotics to cover for possible colitis.  Very strict return precautions given. Recommend close pcp follow up for symptom recheck.  The patient appears reasonably screened and/or stabilized for discharge and I doubt any other medical condition or other [facility] requiring further screening, evaluation, or treatment in  the ED at this time prior to discharge. The patient is safe for discharge with strict return precautions discussed.      Portions of this note were generated with [employer]. Dictation errors may occur despite best attempts at proofreading.       Cherre Robins, PA-C 03/22/19 1759    Lucrezia Starch, MD 03/22/19 [phone]

## 2019-03-22 NOTE — ED Triage Notes (Signed)
Patient complains of abdominal pain with swelling to abdomen x 1 day. Tender and guarding on palpation. Denies lifting or trauma. No fever, no nausea, no vomiting

## 2019-03-22 NOTE — ED Notes (Signed)
Patient verbalizes understanding of discharge instructions. Opportunity for questioning and answers were provided.  pt discharged from ED, ambulatory by self   

## 2019-03-22 NOTE — ED Notes (Signed)
Pt given a urinal.

## 2019-03-22 NOTE — ED Notes (Signed)
Pt returned from CT °

## 2019-03-25 ENCOUNTER — Emergency Department (HOSPITAL_COMMUNITY)
Admission: EM | Admit: 2019-03-25 | Discharge: 2019-03-26 | Disposition: A | Discharge status: Home / Self Care | Payer: No Typology Code available for payment source | Attending: Emergency Medicine | Admitting: Emergency Medicine

## 2019-03-25 ENCOUNTER — Encounter (HOSPITAL_COMMUNITY): Payer: Self-pay

## 2019-03-25 DIAGNOSIS — Z5321 Procedure and treatment not carried out due to patient leaving prior to being seen by health care provider: Secondary | ICD-10-CM | POA: Insufficient documentation

## 2019-03-25 DIAGNOSIS — R1032 Left lower quadrant pain: Secondary | ICD-10-CM | POA: Insufficient documentation

## 2019-03-25 LAB — LIPASE, BLOOD: Lipase: 41 U/L (ref 11–51)

## 2019-03-25 LAB — COMPREHENSIVE METABOLIC PANEL
ALT: 24 U/L (ref 0–44)
AST: 22 U/L (ref 15–41)
Albumin: 4.2 g/dL (ref 3.5–5.0)
Alkaline Phosphatase: 37 U/L — ABNORMAL LOW (ref 38–126)
Anion gap: 9 (ref 5–15)
BUN: 8 mg/dL (ref 6–20)
CO2: 26 mmol/L (ref 22–32)
Calcium: 9.5 mg/dL (ref 8.9–10.3)
Chloride: 105 mmol/L (ref 98–111)
Creatinine, Ser: 1.19 mg/dL (ref 0.61–1.24)
GFR calc Af Amer: >60 mL/min (ref >60)
GFR calc non Af Amer: >60 mL/min (ref >60)
Glucose, Bld: 93 mg/dL (ref 70–99)
Potassium: 3.8 mmol/L (ref 3.5–5.1)
Sodium: 140 mmol/L (ref 135–145)
Total Bilirubin: 0.8 mg/dL (ref 0.3–1.2)
Total Protein: 6.7 g/dL (ref 6.5–8.1)

## 2019-03-25 LAB — URINALYSIS, ROUTINE W REFLEX MICROSCOPIC
Bacteria, UA: NONE SEEN
Bilirubin Urine: NEGATIVE
Glucose, UA: NEGATIVE mg/dL
Hgb urine dipstick: NEGATIVE
Ketones, ur: NEGATIVE mg/dL
Nitrite: NEGATIVE
Protein, ur: NEGATIVE mg/dL
Specific Gravity, Urine: 1.021 (ref 1.005–1.030)
pH: 6 (ref 5.0–8.0)

## 2019-03-25 LAB — CBC
HCT: 49 % (ref 39.0–52.0)
Hemoglobin: 16.3 g/dL (ref 13.0–17.0)
MCH: 30.7 pg (ref 26.0–34.0)
MCHC: 33.3 g/dL (ref 30.0–36.0)
MCV: 92.3 fL (ref 80.0–100.0)
Platelets: 307 10*3/uL (ref 150–400)
RBC: 5.31 MIL/uL (ref 4.22–5.81)
RDW: 13.4 % (ref 11.5–15.5)
WBC: 6 10*3/uL (ref 4.0–10.5)
nRBC: 0 % (ref 0.0–0.2)

## 2019-03-25 MED ORDER — SODIUM CHLORIDE 0.9% FLUSH: 3.0000 mL | Freq: Once | INTRAVENOUS | Status: DC

## 2019-03-25 NOTE — ED Triage Notes (Signed)
Pt states that he has been having L side abd pain since Sat, seen here for the same on 1/31, and diagnosed with colitis. C/o of diarrhea

## 2019-03-26 NOTE — ED Notes (Signed)
Pt called x3 in the waiting room. No response. 

## 2019-07-28 ENCOUNTER — Other Ambulatory Visit: Payer: Self-pay

## 2019-07-28 ENCOUNTER — Emergency Department (HOSPITAL_COMMUNITY)
Admission: EM | Admit: 2019-07-28 | Discharge: 2019-07-28 | Disposition: A | Discharge status: Home / Self Care | Payer: No Typology Code available for payment source | Attending: Emergency Medicine | Admitting: Emergency Medicine

## 2019-07-28 ENCOUNTER — Encounter (HOSPITAL_COMMUNITY): Payer: Self-pay

## 2019-07-28 DIAGNOSIS — Z79899 Other long term (current) drug therapy: Secondary | ICD-10-CM | POA: Insufficient documentation

## 2019-07-28 DIAGNOSIS — M5417 Radiculopathy, lumbosacral region: Secondary | ICD-10-CM | POA: Diagnosis not present

## 2019-07-28 DIAGNOSIS — M545 Low back pain: Secondary | ICD-10-CM | POA: Diagnosis present

## 2019-07-28 DIAGNOSIS — Z91010 Allergy to peanuts: Secondary | ICD-10-CM | POA: Diagnosis not present

## 2019-07-28 MED ORDER — METHOCARBAMOL 500 MG PO TABS: 500.0000 mg | ORAL_TABLET | Freq: Two times a day (BID) | ORAL | 0 refills | Status: DC

## 2019-07-28 MED ORDER — KETOROLAC TROMETHAMINE 30 MG/ML IJ SOLN
60.0000 mg | Freq: Once | INTRAMUSCULAR | Status: AC
  Administered 2019-07-28: 60 mg via INTRAMUSCULAR
  Filled 2019-07-28: qty 2

## 2019-07-28 MED ORDER — METHOCARBAMOL 500 MG PO TABS
500.0000 mg | ORAL_TABLET | Freq: Once | ORAL | Status: AC
  Administered 2019-07-28: 500 mg via ORAL
  Filled 2019-07-28: qty 1

## 2019-07-28 NOTE — ED Triage Notes (Signed)
Pt reports that he has chronic lower back pain after surgery, has bilateral hip pain that shoots down both of his legs, denies loss of bowel or bladder.

## 2019-07-28 NOTE — ED Provider Notes (Signed)
[facility] Provider Note   CSN: [id number] Arrival date & time: 07/28/19  7517     History Chief Complaint  Patient presents with  . Hip Pain    Terry Dunn is a 34 y.o. male.  The history is provided by the patient.  Hip Pain This is a new problem. The current episode started 3 to 5 hours ago. The problem occurs constantly. The problem has been gradually worsening. Pertinent negatives include no abdominal pain. The symptoms are aggravated by walking (certain positions).  Patient history of chronic back pain presents with bilateral hip pain that radiates to his legs.  Patient reports his pain is typically in his back from previous surgeries.  However tonight he is having pain in both hips that goes down the lateral part of his legs.  He reports it feels tingling and numb.  No recent trauma.  No fecal/urinary incontinence.  He is able to ambulate.  Typically when he has pain in his legs it is on the anterior or inner portions.     PMH-chronic back pain Past Surgical History:  Procedure Laterality Date  . BACK SURGERY         No family history on file.  Social History   Tobacco Use  . Smoking status: Never Smoker  . Smokeless tobacco: Never Used  Substance Use Topics  . Alcohol use: Never  . Drug use: Never    Home Medications Prior to Admission medications   Medication Sig Start Date End Date Taking? Authorizing Provider  gabapentin (NEURONTIN) 300 MG capsule Take 300 mg by mouth 3 (three) times daily.     [provider]  oxyCODONE-acetaminophen (PERCOCET) 10-325 MG tablet Take 1 tablet by mouth 4 (four) times daily as needed for pain.    [provider]    Allergies    Peanut-containing drug products  Review of Systems   Review of Systems  Constitutional: Negative for fever.  Gastrointestinal: Negative for abdominal pain.  Genitourinary: Negative for difficulty urinating.  Neurological: Negative for  weakness.       "tingling in legs"   All other systems reviewed and are negative.   Physical Exam Updated Vital Signs BP (!) 145/92 (BP Location: Left Arm)   Pulse 94   Temp 99.1 F (37.3 C) (Oral)   Resp 16   Ht 1.829 m (6')   Wt 103 kg   SpO2 98%   BMI 30.79 kg/m   Physical Exam CONSTITUTIONAL: Well developed/well nourished, mildly uncomfortable appearing HEAD: Normocephalic/atraumatic EYES: EOMI/PERRL ENMT: Mucous membranes moist NECK: supple no meningeal signs SPINE/BACK:entire spine nontender, no bruising/crepitance/stepoffs noted to spine CV: S1/S2 noted, no murmurs/rubs/gallops noted LUNGS: Lungs are clear to auscultation bilaterally, no apparent distress ABDOMEN: soft, nontender, no rebound or guarding GU:no cva tenderness NEURO: Awake/alert, equal motor 5/5 strength noted with the following: hip flexion/knee flexion/extension, foot dorsi/plantar flexion, great toe extension intact bilaterally, no clonus bilaterally,  no sensory deficit in any dermatome.  Equal patellar/achilles reflex noted (2+) in bilateral lower extremities.  Pt is able to ambulate unassisted. EXTREMITIES: pulses normal, full ROM, full range of motion of both hips SKIN: warm, color normal PSYCH: no abnormalities of mood noted, alert and oriented to situation   ED Results / Procedures / Treatments   Labs (all labs ordered are listed, but only abnormal results are displayed) Labs Reviewed - No data to display  EKG None  Radiology No results found.  Procedures Procedures   Medications Ordered in  ED Medications  ketorolac (TORADOL) 30 MG/ML injection 60 mg (60 mg Intramuscular Given 07/28/19 0613)  methocarbamol (ROBAXIN) tablet 500 mg (500 mg Oral Given 07/28/19 7125)    ED Course  I have reviewed the triage vital signs and the nursing notes.     MDM Rules/Calculators/A&P                      Patient presents with bilateral hip pain that radiates in his legs.  Suspect this is  radiculopathy.  He has no neuro deficits.  No red flags. He already has pain medicine at home. 6:45 AM Patient is improved. He is ambulatory.  Reports good relief with Robaxin.  Will prescribe this but discussed need to monitor oversedation with his pain medicines.  He will follow-up with his orthopedic physician.  We discussed return precautions Final Clinical Impression(s) / ED Diagnoses Final diagnoses:  Lumbosacral radiculopathy    Rx / DC Orders ED Discharge Orders         Ordered    methocarbamol (ROBAXIN) 500 MG tablet  2 times daily     07/28/19 0645           Zadie Rhine, MD 07/28/19 [phone]

## 2019-07-28 NOTE — ED Notes (Signed)
Pt ambulated to bed well, independently

## 2019-09-29 ENCOUNTER — Other Ambulatory Visit: Payer: Self-pay

## 2019-09-29 ENCOUNTER — Encounter (HOSPITAL_COMMUNITY): Payer: Self-pay

## 2019-09-29 ENCOUNTER — Emergency Department (HOSPITAL_COMMUNITY)
Admission: EM | Admit: 2019-09-29 | Discharge: 2019-09-30 | Disposition: A | Discharge status: Home / Self Care | Payer: No Typology Code available for payment source | Attending: Emergency Medicine | Admitting: Emergency Medicine

## 2019-09-29 ENCOUNTER — Encounter (HOSPITAL_COMMUNITY): Payer: Self-pay | Admitting: Emergency Medicine

## 2019-09-29 ENCOUNTER — Ambulatory Visit (HOSPITAL_COMMUNITY)
Admission: EM | Admit: 2019-09-29 | Discharge: 2019-09-29 | Disposition: A | Discharge status: Home / Self Care | Payer: No Typology Code available for payment source | Attending: Urgent Care | Admitting: Urgent Care

## 2019-09-29 DIAGNOSIS — R1031 Right lower quadrant pain: Secondary | ICD-10-CM | POA: Insufficient documentation

## 2019-09-29 DIAGNOSIS — R1084 Generalized abdominal pain: Secondary | ICD-10-CM | POA: Diagnosis not present

## 2019-09-29 LAB — CBC WITH DIFFERENTIAL/PLATELET
Abs Immature Granulocytes: 0.03 10*3/uL (ref 0.00–0.07)
Basophils Absolute: 0 10*3/uL (ref 0.0–0.1)
Basophils Relative: 1 %
Eosinophils Absolute: 0.2 10*3/uL (ref 0.0–0.5)
Eosinophils Relative: 3 %
HCT: 44.9 % (ref 39.0–52.0)
Hemoglobin: 15.1 g/dL (ref 13.0–17.0)
Immature Granulocytes: 1 %
Lymphocytes Relative: 49 %
Lymphs Abs: 3.2 10*3/uL (ref 0.7–4.0)
MCH: 30 pg (ref 26.0–34.0)
MCHC: 33.6 g/dL (ref 30.0–36.0)
MCV: 89.1 fL (ref 80.0–100.0)
Monocytes Absolute: 0.6 10*3/uL (ref 0.1–1.0)
Monocytes Relative: 10 %
Neutro Abs: 2.3 10*3/uL (ref 1.7–7.7)
Neutrophils Relative %: 36 %
Platelets: 174 10*3/uL (ref 150–400)
RBC: 5.04 MIL/uL (ref 4.22–5.81)
RDW: 12.7 % (ref 11.5–15.5)
WBC: 6.4 10*3/uL (ref 4.0–10.5)
nRBC: 0 % (ref 0.0–0.2)

## 2019-09-29 LAB — COMPREHENSIVE METABOLIC PANEL
ALT: 118 U/L — ABNORMAL HIGH (ref 0–44)
AST: 54 U/L — ABNORMAL HIGH (ref 15–41)
Albumin: 4.1 g/dL (ref 3.5–5.0)
Alkaline Phosphatase: 40 U/L (ref 38–126)
Anion gap: 11 (ref 5–15)
BUN: 10 mg/dL (ref 6–20)
CO2: 26 mmol/L (ref 22–32)
Calcium: 9.6 mg/dL (ref 8.9–10.3)
Chloride: 103 mmol/L (ref 98–111)
Creatinine, Ser: 1.12 mg/dL (ref 0.61–1.24)
GFR calc Af Amer: >60 mL/min (ref >60)
GFR calc non Af Amer: >60 mL/min (ref >60)
Glucose, Bld: 96 mg/dL (ref 70–99)
Potassium: 3.7 mmol/L (ref 3.5–5.1)
Sodium: 140 mmol/L (ref 135–145)
Total Bilirubin: 0.6 mg/dL (ref 0.3–1.2)
Total Protein: 6.8 g/dL (ref 6.5–8.1)

## 2019-09-29 LAB — POCT URINALYSIS DIPSTICK, ED / UC
Bilirubin Urine: NEGATIVE
Glucose, UA: NEGATIVE mg/dL
Hgb urine dipstick: NEGATIVE
Leukocytes,Ua: NEGATIVE
Nitrite: NEGATIVE
Protein, ur: 30 mg/dL — AB
Specific Gravity, Urine: 1.02 (ref 1.005–1.030)
Urobilinogen, UA: 0.2 mg/dL (ref 0.0–1.0)
pH: 7.5 (ref 5.0–8.0)

## 2019-09-29 MED ORDER — HYDROCODONE-ACETAMINOPHEN 5-325 MG PO TABS
1.0000 | ORAL_TABLET | Freq: Once | ORAL | Status: AC
  Administered 2019-09-29: 1 via ORAL

## 2019-09-29 MED ORDER — HYDROCODONE-ACETAMINOPHEN 5-325 MG PO TABS
ORAL_TABLET | ORAL | Status: AC
  Filled 2019-09-29: qty 1

## 2019-09-29 NOTE — ED Triage Notes (Signed)
Pt is here with abdominal and groin that started yesterday. Pt has not taken any meds to relieve discomfort. Pt last had unprotected sex last week.

## 2019-09-29 NOTE — ED Notes (Signed)
Pt decided to leave AMA.  

## 2019-09-29 NOTE — ED Triage Notes (Signed)
Patient reports right abdominal pain and right groin pain yesterday , denies emesis or diarrhea , seen at urgent care this evening blood/urine tests done .

## 2019-09-29 NOTE — ED Provider Notes (Signed)
[facility]   MRN: [id number] DOB: 10-30-1985  Subjective:   Terry Dunn is a 34 y.o. male presenting for 1 day history of acute onset severe right-sided groin pain, abdominal pain.  Has not taken any medications for relief.  He is married, has unprotected sex with his wife only.  Denies trauma, falls, sport injury.  Denies dysuria, hematuria, constipation.  Has no other symptoms apart from his pain.  He was worried about a hernia.  His work has not been a factor as he is on light duty anyway.  Of note, patient had a CT abdomen in January and showed colitis of the ascending and transverse colon.  This resolved without any persistent issues.  No current facility-administered medications for this encounter.  Current Outpatient Medications:  .  gabapentin (NEURONTIN) 300 MG capsule, Take 300 mg by mouth 3 (three) times daily. , Disp: , Rfl:  .  methocarbamol (ROBAXIN) 500 MG tablet, Take 1 tablet (500 mg total) by mouth 2 (two) times daily., Disp: 10 tablet, Rfl: 0 .  oxyCODONE-acetaminophen (PERCOCET) 10-325 MG tablet, Take 1 tablet by mouth 4 (four) times daily as needed for pain., Disp: , Rfl:    Allergies  Allergen Reactions  . Peanut-Containing Drug Products Anaphylaxis    ALL TREE NUTS    History reviewed. No pertinent past medical history.   Past Surgical History:  Procedure Laterality Date  . BACK SURGERY      Family History  Problem Relation Age of Onset  . Healthy Mother   . Healthy Father     Social History   Tobacco Use  . Smoking status: Never Smoker  . Smokeless tobacco: Never Used  Vaping Use  . Vaping Use: Never used  Substance Use Topics  . Alcohol use: Not Currently  . Drug use: Never    ROS   Objective:   Vitals: BP 127/79 (BP Location: Right Arm)   Pulse 91   Temp 98.7 F (37.1 C) (Oral)   Resp 18   Wt 227 lb (103 kg)   SpO2 100%   BMI 30.79 kg/m   Physical Exam Constitutional:      General: He is not in acute distress.     Appearance: Normal appearance. He is well-developed and normal weight. He is not ill-appearing, toxic-appearing or diaphoretic.  HENT:     Head: Normocephalic and atraumatic.     Right Ear: External ear normal.     Left Ear: External ear normal.     Nose: Nose normal.     Mouth/Throat:     Pharynx: Oropharynx is clear.  Eyes:     General: No scleral icterus.       Right eye: No discharge.        Left eye: No discharge.     Extraocular Movements: Extraocular movements intact.     Pupils: Pupils are equal, round, and reactive to light.  Cardiovascular:     Rate and Rhythm: Normal rate.  Pulmonary:     Effort: Pulmonary effort is normal.  Abdominal:     General: Abdomen is protuberant. Bowel sounds are normal. There is no distension or abdominal bruit. There are no signs of injury.     Palpations: Abdomen is soft. There is no hepatomegaly or mass.     Tenderness: There is abdominal tenderness in the right lower quadrant, periumbilical area and suprapubic area. There is guarding. There is no right CVA tenderness, left CVA tenderness or rebound.     Hernia: No  hernia is present. There is no hernia in the umbilical area, ventral area, left inguinal area, right femoral area, left femoral area or right inguinal area.  Musculoskeletal:     Cervical back: Normal range of motion.  Neurological:     Mental Status: He is alert and oriented to person, place, and time.  Psychiatric:        Mood and Affect: Mood normal.        Behavior: Behavior normal.        Thought Content: Thought content normal.        Judgment: Judgment normal.     Results for orders placed or performed during the hospital encounter of 09/29/19 (from the past 24 hour(s))  POC Urinalysis dipstick     Status: Abnormal   Collection Time: 09/29/19  4:17 PM  Result Value Ref Range   Glucose, UA NEGATIVE NEGATIVE mg/dL   Bilirubin Urine NEGATIVE NEGATIVE   Ketones, ur TRACE (A) NEGATIVE mg/dL   Specific Gravity, Urine  1.020 1.005 - 1.030   Hgb urine dipstick NEGATIVE NEGATIVE   pH 7.5 5.0 - 8.0   Protein, ur 30 (A) NEGATIVE mg/dL   Urobilinogen, UA 0.2 0.0 - 1.0 mg/dL   Nitrite NEGATIVE NEGATIVE   Leukocytes,Ua NEGATIVE NEGATIVE    Assessment and Plan :   PDMP not reviewed this encounter.  1. Generalized abdominal pain   2. Right inguinal pain     Urinalysis is equivocal.  Counseled patient on differential for his severe right groin and abdominal pain.  He is not interested in going to the emergency room now despite our discussion.  I negotiated with the patient, will revisit this with CBC and c-Met.  In the meantime he was given hydrocodone in clinic.  Naproxen as an outpatient, patient refused prescription for this as he has this from the [place].   Jaynee Eagles, [place] 09/29/19 1643

## 2019-11-23 ENCOUNTER — Emergency Department (HOSPITAL_COMMUNITY)
Admission: EM | Admit: 2019-11-23 | Discharge: 2019-11-23 | Disposition: A | Discharge status: Home / Self Care | Payer: No Typology Code available for payment source | Attending: Emergency Medicine | Admitting: Emergency Medicine

## 2019-11-23 ENCOUNTER — Other Ambulatory Visit: Payer: Self-pay

## 2019-11-23 ENCOUNTER — Encounter (HOSPITAL_COMMUNITY): Payer: Self-pay | Admitting: Emergency Medicine

## 2019-11-23 DIAGNOSIS — M5416 Radiculopathy, lumbar region: Secondary | ICD-10-CM | POA: Diagnosis not present

## 2019-11-23 DIAGNOSIS — M545 Low back pain, unspecified: Secondary | ICD-10-CM | POA: Diagnosis present

## 2019-11-23 MED ORDER — ETODOLAC 300 MG PO CAPS: 300.0000 mg | ORAL_CAPSULE | Freq: Three times a day (TID) | ORAL | 0 refills | Status: DC

## 2019-11-23 MED ORDER — HYDROMORPHONE HCL 1 MG/ML IJ SOLN
1.0000 mg | Freq: Once | INTRAMUSCULAR | Status: AC
  Administered 2019-11-23: 1 mg via INTRAMUSCULAR
  Filled 2019-11-23: qty 1

## 2019-11-23 MED ORDER — DEXAMETHASONE SODIUM PHOSPHATE 10 MG/ML IJ SOLN
10.0000 mg | Freq: Once | INTRAMUSCULAR | Status: AC
  Administered 2019-11-23: 10 mg via INTRAMUSCULAR
  Filled 2019-11-23: qty 1

## 2019-11-23 MED ORDER — PREDNISONE 50 MG PO TABS: 50.0000 mg | ORAL_TABLET | Freq: Every day | ORAL | 0 refills | Status: DC

## 2019-11-23 NOTE — Discharge Instructions (Addendum)
Take medications as prescribed.  Continue your home medications. Follow-up with your doctors at the [facility] for further treatment

## 2019-11-23 NOTE — ED Triage Notes (Signed)
Pt reports lower back pain chronic in nature reports mother had same. Denies injury  States this episode started while he was standing at the stove cooking last evening. Pain radiates down bilat legs

## 2019-11-23 NOTE — ED Provider Notes (Signed)
[facility]-EMERGENCY DEPT Provider Note   CSN: [id number] Arrival date & time: 11/23/19  1839     History Chief Complaint  Patient presents with  . Back Pain    Terry Dunn is a 34 y.o. male.  HPI   Patient has a history of recurrent back pain.  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  He also had an MRI either this year or last year showing lumbar disc disease .patient states starting last evening he began having recurrent pain in his lower back.  He feels that tingling and burning down his legs.  Patient denies any weakness.  He denies any numbness.  Movement and palpation exacerbate the pain.  He denies any fevers or chills.  Patient states he called the [facility] and he was instructed to come to the ED for evaluation  History reviewed. No pertinent past medical history.  There are no problems to display for this patient.   Past Surgical History:  Procedure Laterality Date  . BACK SURGERY         Family History  Problem Relation Age of Onset  . Healthy Mother   . Healthy Father     Social History   Tobacco Use  . Smoking status: Never Smoker  . Smokeless tobacco: Never Used  Vaping Use  . Vaping Use: Never used  Substance Use Topics  . Alcohol use: Not Currently  . Drug use: Never    Home Medications Prior to Admission medications   Medication Sig Start Date End Date Taking? Authorizing Provider  etodolac (LODINE) 300 MG capsule Take 1 capsule (300 mg total) by mouth every 8 (eight) hours. 11/23/19   Linwood Dibbles, MD  gabapentin (NEURONTIN) 300 MG capsule Take 300 mg by mouth 3 (three) times daily.     [provider]  methocarbamol (ROBAXIN) 500 MG tablet Take 1 tablet (500 mg total) by mouth 2 (two) times daily. 07/28/19   Zadie Rhine, MD  oxyCODONE-acetaminophen (PERCOCET) 10-325 MG tablet Take 1 tablet by mouth 4 (four) times daily as needed for pain.    [provider]   predniSONE (DELTASONE) 50 MG tablet Take 1 tablet (50 mg total) by mouth daily. 11/23/19   Linwood Dibbles, MD    Allergies    Peanut-containing drug products  Review of Systems   Review of Systems  All other systems reviewed and are negative.   Physical Exam Updated Vital Signs BP (!) 130/91 (BP Location: Right Arm)   Pulse 78   Temp 99.7 F (37.6 C) (Oral)   Resp 18   Ht 1.829 m (6')   Wt 97 kg   SpO2 99%   BMI 29.01 kg/m   Physical Exam Vitals and nursing note reviewed.  Constitutional:      General: He is not in acute distress.    Appearance: He is well-developed.  HENT:     Head: Normocephalic and atraumatic.     Right Ear: External ear normal.     Left Ear: External ear normal.  Eyes:     General: No scleral icterus.       Right eye: No discharge.        Left eye: No discharge.     Conjunctiva/sclera: Conjunctivae normal.  Neck:     Trachea: No tracheal deviation.  Cardiovascular:     Rate and Rhythm: Normal rate.  Pulmonary:     Effort: Pulmonary effort is normal. No respiratory distress.  Breath sounds: No stridor.  Musculoskeletal:        General: Tenderness present. No swelling.     Cervical back: Neck supple.     Lumbar back: Spasms and tenderness present. Decreased range of motion.  Skin:    General: Skin is warm and dry.     Findings: No rash.  Neurological:     Mental Status: He is alert.     Cranial Nerves: Cranial nerve deficit: no gross deficits.     Comments: 5 out of 5 strength and sensation bilateral lower extremities     ED Results / Procedures / Treatments   Labs (all labs ordered are listed, but only abnormal results are displayed) Labs Reviewed - No data to display  EKG None  Radiology No results found.  Procedures Procedures (including critical care time)  Medications Ordered in ED Medications  HYDROmorphone (DILAUDID) injection 1 mg (has no administration in time range)  dexamethasone (DECADRON) injection 10 mg (has  no administration in time range)    ED Course  I have reviewed the triage vital signs and the nursing notes.  Pertinent labs & imaging results that were available during my care of the patient were reviewed by me and considered in my medical decision making (see chart for details).  Clinical Course as of Nov 23 2250  Mon Nov 23, 2019  2247 120 oxycodone on 9/22   [JK]    Clinical Course User Index [JK] Linwood Dibbles, MD   MDM Rules/Calculators/A&P                          Patient presents ED with complaints of acute on chronic back pain.  Patient has symptoms suggesting lumbar radiculopathy but fortunately no signs of acute neurologic dysfunction.  Patient denies any bowel or bladder issues.  He has normal strength.  Patient was given a dose of pain medication as well as steroids.  Will discharge him home on a course of steroids and recommend outpatient follow-up with his doctors at the [facility] Final Clinical Impression(s) / ED Diagnoses Final diagnoses:  Lumbar radiculopathy    Rx / DC Orders ED Discharge Orders         Ordered    predniSONE (DELTASONE) 50 MG tablet  Daily        11/23/19 2251    etodolac (LODINE) 300 MG capsule  Every 8 hours       Note to Pharmacy: As needed for pain   11/23/19 2251           Linwood Dibbles, MD 11/23/19 2308

## 2019-12-09 ENCOUNTER — Other Ambulatory Visit: Payer: Self-pay

## 2019-12-09 ENCOUNTER — Emergency Department (HOSPITAL_COMMUNITY): Payer: No Typology Code available for payment source

## 2019-12-09 ENCOUNTER — Emergency Department (HOSPITAL_COMMUNITY)
Admission: EM | Admit: 2019-12-09 | Discharge: 2019-12-09 | Disposition: A | Discharge status: Home / Self Care | Payer: No Typology Code available for payment source | Attending: Emergency Medicine | Admitting: Emergency Medicine

## 2019-12-09 ENCOUNTER — Encounter (HOSPITAL_COMMUNITY): Payer: Self-pay

## 2019-12-09 DIAGNOSIS — M5432 Sciatica, left side: Secondary | ICD-10-CM | POA: Insufficient documentation

## 2019-12-09 DIAGNOSIS — Z91010 Allergy to peanuts: Secondary | ICD-10-CM | POA: Diagnosis not present

## 2019-12-09 DIAGNOSIS — M549 Dorsalgia, unspecified: Secondary | ICD-10-CM | POA: Diagnosis present

## 2019-12-09 MED ORDER — PREDNISONE 10 MG (21) PO TBPK: ORAL_TABLET | Freq: Every day | ORAL | 0 refills | Status: DC

## 2019-12-09 MED ORDER — CYCLOBENZAPRINE HCL 10 MG PO TABS: 10.0000 mg | ORAL_TABLET | Freq: Two times a day (BID) | ORAL | 0 refills | Status: DC | PRN

## 2019-12-09 MED ORDER — OXYCODONE-ACETAMINOPHEN 5-325 MG PO TABS
1.0000 | ORAL_TABLET | Freq: Once | ORAL | Status: AC
  Administered 2019-12-09: 1 via ORAL
  Filled 2019-12-09: qty 1

## 2019-12-09 NOTE — ED Triage Notes (Signed)
Pt presents with c/o back pain. Pt reports he has a hx of chronic back pain. Pt reports the pain is in his back, both hips, and both legs.

## 2019-12-09 NOTE — Discharge Instructions (Signed)
Recommend follow-up with your orthopedic surgeon office regarding your back pain.  You should be seen ideally by both the spine surgeon as well as Dr. Modesta Messing.  Recommend taking the course of steroids.  Can take your previously prescribed Percocet as needed for breakthrough pain, recommend Tylenol or Motrin for most of your pain.  Can also take the prescribed Flexeril which is a muscle relaxer.  Please note the muscle relaxer and the Percocet can make you drowsy should not be taken while driving or operating heavy machinery.

## 2019-12-10 NOTE — ED Provider Notes (Addendum)
[facility]-EMERGENCY DEPT Provider Note   CSN: [id number] Arrival date & time: 12/09/19  1626     History Chief Complaint  Patient presents with  . Back Pain    Terry Dunn is a 34 y.o. male.  Presents to ER with concern for back pain.  Patient reports he has a history of back pain, has had prior surgery and lower back previously.  States that pain has been flaring up over the last few days.  Sharp stabbing, worse on left leg but also occurs on right leg 2.  Has had some tingling sensation but no numbness or weakness.  Is able to walk but has pain with ambulation.  Reports that he is seen by [facility], Dr. Ethelene Hal but has had issues following up due to [facility] authorization.  No bladder or bowel incontinence, no fever.  HPI     History reviewed. No pertinent past medical history.  There are no problems to display for this patient.   Past Surgical History:  Procedure Laterality Date  . BACK SURGERY         Family History  Problem Relation Age of Onset  . Healthy Mother   . Healthy Father     Social History   Tobacco Use  . Smoking status: Never Smoker  . Smokeless tobacco: Never Used  Vaping Use  . Vaping Use: Never used  Substance Use Topics  . Alcohol use: Not Currently  . Drug use: Never    Home Medications Prior to Admission medications   Medication Sig Start Date End Date Taking? Authorizing Provider  cyclobenzaprine (FLEXERIL) 10 MG tablet Take 1 tablet (10 mg total) by mouth 2 (two) times daily as needed for muscle spasms. 12/09/19   Milagros Loll, MD  etodolac (LODINE) 300 MG capsule Take 1 capsule (300 mg total) by mouth every 8 (eight) hours. 11/23/19   Linwood Dibbles, MD  gabapentin (NEURONTIN) 300 MG capsule Take 300 mg by mouth 3 (three) times daily.     [provider]  methocarbamol (ROBAXIN) 500 MG tablet Take 1 tablet (500 mg total) by mouth 2 (two) times daily. 07/28/19   Zadie Rhine, MD  oxyCODONE-acetaminophen  (PERCOCET) 10-325 MG tablet Take 1 tablet by mouth 4 (four) times daily as needed for pain.    [provider]  predniSONE (DELTASONE) 50 MG tablet Take 1 tablet (50 mg total) by mouth daily. 11/23/19   Linwood Dibbles, MD  predniSONE (STERAPRED UNI-PAK 21 TAB) 10 MG (21) TBPK tablet Take by mouth daily. Take 6 tabs by mouth daily  for 1 day, then 5 tabs for 1days, then 4 tabs for 1 days, then 3 tabs for 1 days, 2 tabs for 1 days, then 1 tab by mouth daily for 1 days 12/09/19   Milagros Loll, MD    Allergies    Peanut-containing drug products  Review of Systems   Review of Systems  Constitutional: Negative for chills and fever.  HENT: Negative for ear pain and sore throat.   Eyes: Negative for pain and visual disturbance.  Respiratory: Negative for cough and shortness of breath.   Cardiovascular: Negative for chest pain and palpitations.  Gastrointestinal: Negative for abdominal pain and vomiting.  Genitourinary: Negative for dysuria and hematuria.  Musculoskeletal: Positive for arthralgias and back pain.  Skin: Negative for color change and rash.  Neurological: Negative for seizures and syncope.  All other systems reviewed and are negative.   Physical Exam Updated Vital Signs BP Marland Kitchen)  150/104 (BP Location: Right Arm)   Pulse 88   Temp 97.9 F (36.6 C) (Oral)   Resp 15   SpO2 100%   Physical Exam Vitals and nursing note reviewed.  Constitutional:      Appearance: He is well-developed.  HENT:     Head: Normocephalic and atraumatic.  Eyes:     Conjunctiva/sclera: Conjunctivae normal.  Cardiovascular:     Rate and Rhythm: Normal rate.     Pulses: Normal pulses.  Pulmonary:     Effort: Pulmonary effort is normal. No respiratory distress.  Musculoskeletal:        General: No swelling, tenderness, deformity or signs of injury.     Cervical back: Neck supple.     Comments: Some tenderness across lower lumbar spine, no step-off or deformity noted  Skin:    General:  Skin is warm and dry.     Capillary Refill: Capillary refill takes less than 2 seconds.  Neurological:     General: No focal deficit present.     Mental Status: He is alert.     Comments: 5/5 strength in lower extremities, sensation to light touch intact throughout lower extremities     ED Results / Procedures / Treatments   Labs (all labs ordered are listed, but only abnormal results are displayed) Labs Reviewed - No data to display  EKG None  Radiology DG Lumbar Spine Complete  Result Date: 12/09/2019 CLINICAL DATA:  Left hip and low back pain. EXAM: LUMBAR SPINE - COMPLETE 4+ VIEW COMPARISON:  None. FINDINGS: Prior disc replacement noted at L5-S1. Normal alignment. Disc spaces are maintained. Mild degenerative facet disease at L5-S1. No fracture or subluxation. SI joints symmetric and unremarkable. IMPRESSION: Postoperative changes at L5-S1.  No acute bony abnormality. Mild degenerative facet disease at L5-S1. Electronically Signed   By: Charlett Nose M.D.   On: 12/09/2019 17:53   DG Hip Unilat W or Wo Pelvis 2-3 Views Left  Result Date: 12/09/2019 CLINICAL DATA:  Left hip and low back pain EXAM: DG HIP (WITH OR WITHOUT PELVIS) 2-3V LEFT COMPARISON:  None. FINDINGS: Postoperative changes noted at the lumbosacral junction. Hip joints and SI joints are symmetric and unremarkable. No acute bony abnormality. Specifically, no fracture, subluxation, or dislocation. IMPRESSION: No acute bony abnormality. Electronically Signed   By: Charlett Nose M.D.   On: 12/09/2019 17:52    Procedures Procedures (including critical care time)  Medications Ordered in ED Medications  oxyCODONE-acetaminophen (PERCOCET/ROXICET) 5-325 MG per tablet 1 tablet (1 tablet Oral Given 12/09/19 1715)    ED Course  I have reviewed the triage vital signs and the nursing notes.  Pertinent labs & imaging results that were available during my care of the patient were reviewed by me and considered in my medical  decision making (see chart for details).    MDM Rules/Calculators/A&P                         34 year old male presents to ER with concern for acute on chronic back pain.  Neurovascularly intact, strength and sensation intact.  Plain films grossly stable.  No red flag symptoms, reassuring exam.  Suspect sciatica, recommend he follow-up with his spine specialist at [facility].  Pain well controlled at present, he receives large Percocet prescription monthly from orthopedic office. Will give rx for course of steroid taper.   After the discussed management above, the patient was determined to be safe for discharge.  The patient was in  agreement with this plan and all questions regarding their care were answered.  ED return precautions were discussed and the patient will return to the ED with any significant worsening of condition.   Final Clinical Impression(s) / ED Diagnoses Final diagnoses:  Sciatica of left side    Rx / DC Orders ED Discharge Orders         Ordered    predniSONE (STERAPRED UNI-PAK 21 TAB) 10 MG (21) TBPK tablet  Daily        12/09/19 1807    cyclobenzaprine (FLEXERIL) 10 MG tablet  2 times daily PRN        12/09/19 1807           Milagros Loll, MD 12/10/19 0007    Milagros Loll, MD 12/10/19 0008

## 2020-01-27 ENCOUNTER — Other Ambulatory Visit: Payer: Self-pay

## 2020-01-27 ENCOUNTER — Emergency Department (HOSPITAL_COMMUNITY)
Admission: EM | Admit: 2020-01-27 | Discharge: 2020-01-28 | Disposition: A | Discharge status: Home / Self Care | Payer: No Typology Code available for payment source | Attending: Emergency Medicine | Admitting: Emergency Medicine

## 2020-01-27 ENCOUNTER — Encounter (HOSPITAL_COMMUNITY): Payer: Self-pay | Admitting: Emergency Medicine

## 2020-01-27 DIAGNOSIS — K429 Umbilical hernia without obstruction or gangrene: Secondary | ICD-10-CM | POA: Insufficient documentation

## 2020-01-27 DIAGNOSIS — R112 Nausea with vomiting, unspecified: Secondary | ICD-10-CM | POA: Diagnosis not present

## 2020-01-27 DIAGNOSIS — R197 Diarrhea, unspecified: Secondary | ICD-10-CM | POA: Diagnosis not present

## 2020-01-27 DIAGNOSIS — Z91010 Allergy to peanuts: Secondary | ICD-10-CM | POA: Insufficient documentation

## 2020-01-27 DIAGNOSIS — R1012 Left upper quadrant pain: Secondary | ICD-10-CM | POA: Diagnosis present

## 2020-01-27 DIAGNOSIS — K5900 Constipation, unspecified: Secondary | ICD-10-CM | POA: Insufficient documentation

## 2020-01-27 LAB — CBC
HCT: 47.7 % (ref 39.0–52.0)
Hemoglobin: 16.2 g/dL (ref 13.0–17.0)
MCH: 30 pg (ref 26.0–34.0)
MCHC: 34 g/dL (ref 30.0–36.0)
MCV: 88.3 fL (ref 80.0–100.0)
Platelets: 283 10*3/uL (ref 150–400)
RBC: 5.4 MIL/uL (ref 4.22–5.81)
RDW: 12.6 % (ref 11.5–15.5)
WBC: 5.2 10*3/uL (ref 4.0–10.5)
nRBC: 0 % (ref 0.0–0.2)

## 2020-01-27 LAB — COMPREHENSIVE METABOLIC PANEL
ALT: 21 U/L (ref 0–44)
AST: 21 U/L (ref 15–41)
Albumin: 4.8 g/dL (ref 3.5–5.0)
Alkaline Phosphatase: 43 U/L (ref 38–126)
Anion gap: 9 (ref 5–15)
BUN: 11 mg/dL (ref 6–20)
CO2: 25 mmol/L (ref 22–32)
Calcium: 9.3 mg/dL (ref 8.9–10.3)
Chloride: 103 mmol/L (ref 98–111)
Creatinine, Ser: 1.24 mg/dL (ref 0.61–1.24)
GFR, Estimated: >60 mL/min (ref >60)
Glucose, Bld: 97 mg/dL (ref 70–99)
Potassium: 3.8 mmol/L (ref 3.5–5.1)
Sodium: 137 mmol/L (ref 135–145)
Total Bilirubin: 1 mg/dL (ref 0.3–1.2)
Total Protein: 7.7 g/dL (ref 6.5–8.1)

## 2020-01-27 LAB — URINALYSIS, ROUTINE W REFLEX MICROSCOPIC
Bacteria, UA: NONE SEEN
Bilirubin Urine: NEGATIVE
Glucose, UA: NEGATIVE mg/dL
Hgb urine dipstick: NEGATIVE
Ketones, ur: 5 mg/dL — AB
Nitrite: NEGATIVE
Protein, ur: NEGATIVE mg/dL
Specific Gravity, Urine: 1.016 (ref 1.005–1.030)
pH: 6 (ref 5.0–8.0)

## 2020-01-27 LAB — LIPASE, BLOOD: Lipase: 26 U/L (ref 11–51)

## 2020-01-27 MED ORDER — ONDANSETRON 4 MG PO TBDP
4.0000 mg | ORAL_TABLET | Freq: Once | ORAL | Status: AC | PRN
  Administered 2020-01-27: 4 mg via ORAL
  Filled 2020-01-27: qty 1

## 2020-01-27 MED ORDER — OXYCODONE-ACETAMINOPHEN 5-325 MG PO TABS
1.0000 | ORAL_TABLET | ORAL | Status: DC | PRN
  Administered 2020-01-27: 1 via ORAL
  Filled 2020-01-27: qty 1

## 2020-01-27 MED ORDER — ONDANSETRON 4 MG PO TBDP
4.0000 mg | ORAL_TABLET | Freq: Once | ORAL | Status: AC
  Administered 2020-01-27: 4 mg via ORAL
  Filled 2020-01-27: qty 1

## 2020-01-27 NOTE — ED Triage Notes (Addendum)
Patient complaining of abdominal pain that started Monday night. Patient is also complaining of nausea and vomiting.

## 2020-01-28 ENCOUNTER — Emergency Department (HOSPITAL_COMMUNITY): Payer: No Typology Code available for payment source

## 2020-01-28 MED ORDER — SODIUM CHLORIDE (PF) 0.9 % IJ SOLN
INTRAMUSCULAR | Status: AC
  Filled 2020-01-28: qty 50

## 2020-01-28 MED ORDER — ONDANSETRON HCL 4 MG/2ML IJ SOLN
INTRAMUSCULAR | Status: AC
  Administered 2020-01-28: 4 mg via INTRAVENOUS
  Filled 2020-01-28: qty 2

## 2020-01-28 MED ORDER — MORPHINE SULFATE (PF) 4 MG/ML IV SOLN: 4.0000 mg | Freq: Once | INTRAVENOUS | Status: AC

## 2020-01-28 MED ORDER — IOHEXOL 300 MG/ML  SOLN
100.0000 mL | Freq: Once | INTRAMUSCULAR | Status: AC | PRN
  Administered 2020-01-28: 100 mL via INTRAVENOUS

## 2020-01-28 MED ORDER — POLYETHYLENE GLYCOL 3350 17 GM/SCOOP PO POWD: 1.0000 | Freq: Once | ORAL | 0 refills | Status: AC

## 2020-01-28 MED ORDER — MORPHINE SULFATE (PF) 4 MG/ML IV SOLN
INTRAVENOUS | Status: AC
  Administered 2020-01-28: 4 mg via INTRAVENOUS
  Filled 2020-01-28: qty 1

## 2020-01-28 MED ORDER — BISACODYL 5 MG PO TBEC: 5.0000 mg | DELAYED_RELEASE_TABLET | Freq: Every day | ORAL | 0 refills | Status: DC | PRN

## 2020-01-28 MED ORDER — ONDANSETRON HCL 4 MG/2ML IJ SOLN: 4.0000 mg | Freq: Once | INTRAMUSCULAR | Status: AC

## 2020-01-28 MED ORDER — ONDANSETRON 4 MG PO TBDP: 4.0000 mg | ORAL_TABLET | Freq: Three times a day (TID) | ORAL | 0 refills | Status: DC | PRN

## 2020-01-28 NOTE — Discharge Instructions (Addendum)
Thank you for allowing me to care for you today in the Emergency Department.   You were seen today for left-sided abdominal pain.  Your work-up was consistent with constipation.   Start taking MiraLAX once daily.  You can continue this until you are having soft, daily bowel movements.  You can mix it with water or orange juice to help with your symptoms.  You can also try drinking prune juice to help with constipation.  You can cut back on MiraLAX after your bowel movements become regular and soft.  You can also take 1 tablet of bisacodyl daily as needed for constipation.  Make sure that you are drinking 64 ounces of water daily.  You should increase this amount if you are exercising vigorously.  Make sure that you are maintaining a high-fiber diet to help with regular bowel movements.  If your symptoms do not significantly improve with this regimen, please follow-up with primary care.  If you do not have a primary care provider, you can call the number on your discharge paperwork to get established with 1.  Return to the emergency department if you develop severe swelling in your abdomen, uncontrollable vomiting, if you stop passing stool and stop passing gas, or develop other new, concerning symptoms.

## 2020-01-28 NOTE — ED Notes (Signed)
Patient has not vomited or felt nauseas

## 2020-01-28 NOTE — ED Provider Notes (Signed)
[facility]-EMERGENCY DEPT Provider Note   CSN: [id number] Arrival date & time: 01/27/20  1913     History Chief Complaint  Patient presents with  . Abdominal Pain    Terry Dunn is a 34 y.o. male with no significant past medical history who presents the emergency department with a chief complaint of abdominal pain.   The patient reports that he developed sudden onset left upper and lower abdominal pain 2 nights ago that began shortly after lifting weights at the gym.  He characterizes the pain as sharp and stabbing.  Pain is worse after eating and with some positional changes.  No other known aggravating or alleviating factors.  He reports that he had one episode of loose stools yesterday, but had 1 small, hard bowel movement earlier today.  He also reports that he had some nonbloody, nonbilious vomiting yesterday accompanied by nausea, but he did not have any vomiting until he arrived in the emergency department tonight.  In the past, the patient states that he has noticed a bulge in his upper abdomen, but reports that he has never been diagnosed with a hernia.  He denies fever, chills, dysuria, hematuria, penile or testicular pain or swelling, cough, chest pain, shortness of breath.  He does report that he previously was diagnosed with colitis 2 times.  He is unsure of the etiology of colitis.  No history of Crohn's disease or ulcerative colitis and there is no family history as well.  No history of hernias.  No treatment prior to arrival.  No history of abdominal surgery.  He has been passing flatus without difficulty.  He is a never smoker.  He denies alcohol use.  He denies illicit or recreational drug use.  The history is provided by the patient and medical records. No language interpreter was used.       History reviewed. No pertinent past medical history.  There are no problems to display for this patient.   Past Surgical History:  Procedure Laterality  Date  . BACK SURGERY         Family History  Problem Relation Age of Onset  . Healthy Mother   . Healthy Father     Social History   Tobacco Use  . Smoking status: Never Smoker  . Smokeless tobacco: Never Used  Vaping Use  . Vaping Use: Never used  Substance Use Topics  . Alcohol use: Not Currently  . Drug use: Never    Home Medications Prior to Admission medications   Medication Sig Start Date End Date Taking? Authorizing Provider  bisacodyl (DULCOLAX) 5 MG EC tablet Take 1 tablet (5 mg total) by mouth daily as needed for moderate constipation. 01/28/20   Almir Botts A, PA-C  cyclobenzaprine (FLEXERIL) 10 MG tablet Take 1 tablet (10 mg total) by mouth 2 (two) times daily as needed for muscle spasms. 12/09/19   Milagros Loll, MD  etodolac (LODINE) 300 MG capsule Take 1 capsule (300 mg total) by mouth every 8 (eight) hours. 11/23/19   Linwood Dibbles, MD  gabapentin (NEURONTIN) 300 MG capsule Take 300 mg by mouth 3 (three) times daily.     [provider]  methocarbamol (ROBAXIN) 500 MG tablet Take 1 tablet (500 mg total) by mouth 2 (two) times daily. 07/28/19   Zadie Rhine, MD  ondansetron (ZOFRAN ODT) 4 MG disintegrating tablet Take 1 tablet (4 mg total) by mouth every 8 (eight) hours as needed. 01/28/20   Jemimah Cressy A, PA-C  oxyCODONE-acetaminophen (PERCOCET) 10-325 MG tablet Take 1 tablet by mouth 4 (four) times daily as needed for pain.    [provider]  polyethylene glycol powder (MIRALAX) 17 GM/SCOOP powder Take 255 g by mouth once for 1 dose. 01/28/20 01/28/20  Marthena Whitmyer A, PA-C  predniSONE (DELTASONE) 50 MG tablet Take 1 tablet (50 mg total) by mouth daily. 11/23/19   Linwood DibblesKnapp, Jon, MD  predniSONE (STERAPRED UNI-PAK 21 TAB) 10 MG (21) TBPK tablet Take by mouth daily. Take 6 tabs by mouth daily  for 1 day, then 5 tabs for 1days, then 4 tabs for 1 days, then 3 tabs for 1 days, 2 tabs for 1 days, then 1 tab by mouth daily for 1 days 12/09/19   Milagros Lollykstra,  Richard S, MD    Allergies    Peanut-containing drug products and Contrast media [iodinated diagnostic agents]  Review of Systems   Review of Systems  Constitutional: Negative for appetite change, chills and fever.  HENT: Negative for congestion and sore throat.   Respiratory: Negative for cough, shortness of breath and wheezing.   Cardiovascular: Negative for chest pain, palpitations and leg swelling.  Gastrointestinal: Positive for abdominal pain, blood in stool, constipation, diarrhea, nausea and vomiting.  Genitourinary: Negative for dysuria.  Musculoskeletal: Negative for back pain and myalgias.  Skin: Negative for rash.  Allergic/Immunologic: Negative for immunocompromised state.  Neurological: Negative for seizures, syncope, weakness, numbness and headaches.  Psychiatric/Behavioral: Negative for confusion.    Physical Exam Updated Vital Signs BP 121/80 (BP Location: Left Arm)   Pulse 74   Temp 98.3 F (36.8 C) (Oral)   Resp 13   Ht 6' (1.829 m)   Wt 108.4 kg   SpO2 100%   BMI 32.41 kg/m   Physical Exam Vitals and nursing note reviewed.  Constitutional:      General: He is not in acute distress.    Appearance: He is well-developed. He is not ill-appearing, toxic-appearing or diaphoretic.  HENT:     Head: Normocephalic.  Eyes:     Conjunctiva/sclera: Conjunctivae normal.  Cardiovascular:     Rate and Rhythm: Normal rate and regular rhythm.     Heart sounds: No murmur heard.   Pulmonary:     Effort: Pulmonary effort is normal. No respiratory distress.     Breath sounds: No stridor. No wheezing, rhonchi or rales.  Chest:     Chest wall: No tenderness.  Abdominal:     General: Bowel sounds are normal. There is no distension.     Palpations: Abdomen is soft. There is no mass.     Tenderness: There is abdominal tenderness. There is guarding. There is no right CVA tenderness, left CVA tenderness or rebound. Negative signs include Rovsing's sign.     Musculoskeletal:     Cervical back: Neck supple.  Skin:    General: Skin is warm and dry.  Neurological:     Mental Status: He is alert.  Psychiatric:        Behavior: Behavior normal.     ED Results / Procedures / Treatments   Labs (all labs ordered are listed, but only abnormal results are displayed) Labs Reviewed  URINALYSIS, ROUTINE W REFLEX MICROSCOPIC - Abnormal; Notable for the following components:      Result Value   Ketones, ur 5 (*)    Leukocytes,Ua SMALL (*)    All other components within normal limits  LIPASE, BLOOD  COMPREHENSIVE METABOLIC PANEL  CBC    EKG None  Radiology  CT ABDOMEN PELVIS W CONTRAST  Result Date: 01/28/2020 CLINICAL DATA:  Left upper quadrant abdominal pain. EXAM: CT ABDOMEN AND PELVIS WITH CONTRAST TECHNIQUE: Multidetector CT imaging of the abdomen and pelvis was performed using the standard protocol following bolus administration of intravenous contrast. CONTRAST:  OMNIPAQUE IOHEXOL 300 MG/ML  SOLN COMPARISON:  March 22, 2019 FINDINGS: Lower chest: The lung bases are clear. The heart size is normal. Hepatobiliary: The liver is normal. Normal gallbladder.There is no biliary ductal dilation. Pancreas: Normal contours without ductal dilatation. No peripancreatic fluid collection. Spleen: Unremarkable. Adrenals/Urinary Tract: --Adrenal glands: Unremarkable. --Right kidney/ureter: No hydronephrosis or radiopaque kidney stones. --Left kidney/ureter: No hydronephrosis or radiopaque kidney stones. --Urinary bladder: Unremarkable. Stomach/Bowel: --Stomach/Duodenum: No hiatal hernia or other gastric abnormality. Normal duodenal course and caliber. --Small bowel: Unremarkable. --Colon: The majority of the colon is underdistended. There is a large amount of stool in the ascending colon. --Appendix: Normal. Vascular/Lymphatic: Normal course and caliber of the major abdominal vessels. --No retroperitoneal lymphadenopathy. --No mesenteric  lymphadenopathy. --No pelvic or inguinal lymphadenopathy. Reproductive: Unremarkable Other: No ascites or free air. There is a small fat containing umbilical hernia. Musculoskeletal. No acute displaced fractures. IMPRESSION: 1. No definite acute abdominopelvic abnormality. 2. Underdistention of the descending colon and sigmoid colon limits evaluation. Underlying colitis is difficult to exclude and should be correlated with the patient's symptoms. 3. Large amount of stool in the ascending colon. 4. Normal appendix. Electronically Signed   By: Katherine Mantle M.D.   On: 01/28/2020 03:14    Procedures Procedures (including critical care time)  Medications Ordered in ED Medications  oxyCODONE-acetaminophen (PERCOCET/ROXICET) 5-325 MG per tablet 1 tablet (1 tablet Oral Given 01/27/20 1928)  sodium chloride (PF) 0.9 % injection (has no administration in time range)  ondansetron (ZOFRAN-ODT) disintegrating tablet 4 mg (4 mg Oral Given 01/27/20 1927)  ondansetron (ZOFRAN-ODT) disintegrating tablet 4 mg (4 mg Oral Given 01/27/20 2359)  morphine 4 MG/ML injection 4 mg (4 mg Intravenous Given 01/28/20 0229)  ondansetron (ZOFRAN) injection 4 mg (4 mg Intravenous Given 01/28/20 0230)  iohexol (OMNIPAQUE) 300 MG/ML solution 100 mL (100 mLs Intravenous Contrast Given 01/28/20 0256)    ED Course  I have reviewed the triage vital signs and the nursing notes.  Pertinent labs & imaging results that were available during my care of the patient were reviewed by me and considered in my medical decision making (see chart for details).    MDM Rules/Calculators/A&P                          34 year old male with no chronic medical conditions who presents the emergency department with 2 days of left-sided upper and lower abdominal pain, nausea, vomiting, one episode of diarrhea, and most recently constipation.  No constitutional symptoms.  He notes that symptoms began suddenly after lifting weights at the gym.  Vital  signs are stable.  Labs and imaging have been independently reviewed and interpreted by me.  Labs are reassuring.  No leukocytosis.  However, he has guarding and significant tenderness to palpation in the left upper and lower quadrant on exam.  Given that there is some concern for a hernia as the symptoms began after lifting weights, will order CT abdomen pelvis for further evaluation.  CT with under distention of the descending colon and sigmoid colon.  Underlying colitis is difficult to exclude.  However, I suspect this is less likely given patient's presenting symptoms.  There is a large amount  of stool in the ascending colon.  No evidence of obstruction.  Of note, patient began vomiting after given IV contrast.  He notes that this previously happened before.  We will add vomiting to the patient's allergy list for contrast dye.  Of note he did not have any rash, difficulty breathing, or other signs or symptoms concerning for anaphylaxis or allergic reaction.  CT findings were discussed with the patient.  He was successfully fluid challenge.  On reevaluation, his pain is markedly improved after pain control.  He will be discharged home on a bowel regimen.  ER return precautions given.  He is hemodynamically stable no acute distress.  Safe for discharge home with outpatient follow-up as needed.  Final Clinical Impression(s) / ED Diagnoses Final diagnoses:  Constipation, unspecified constipation type  Umbilical hernia without obstruction and without gangrene    Rx / DC Orders ED Discharge Orders         Ordered    ondansetron (ZOFRAN ODT) 4 MG disintegrating tablet  Every 8 hours PRN        01/28/20 0537    polyethylene glycol powder (MIRALAX) 17 GM/SCOOP powder   Once        01/28/20 0537    bisacodyl (DULCOLAX) 5 MG EC tablet  Daily PRN        01/28/20 0537           Caydence Koenig, Pedro Earls A, PA-C 01/28/20 0819    Melene Plan, DO 01/28/20 2303

## 2020-03-17 ENCOUNTER — Other Ambulatory Visit: Payer: Self-pay | Admitting: Neurosurgery

## 2020-03-17 ENCOUNTER — Telehealth: Payer: Self-pay

## 2020-03-17 DIAGNOSIS — M5440 Lumbago with sciatica, unspecified side: Secondary | ICD-10-CM

## 2020-03-17 NOTE — Telephone Encounter (Signed)
Phone call to patient to verify medication list and allergies for myelogram procedure. Pt instructed to hold Promethazine for 48hrs prior to myelogram appointment time and 24 hours after appointment. Pt also instructed to have a driver the day of the procedure, the procedure would take around 2 hours, and discharge instructions discussed. Pt verbalized understanding.

## 2020-03-28 ENCOUNTER — Telehealth: Payer: Self-pay

## 2020-03-28 NOTE — Telephone Encounter (Signed)
Questioned pt concerning nausea and vomiting related to contrast allergy.  Pt reported after contrast was injected, he felt warm and immediately started vomiting on the table.  Agreed that he prefers Zofran be given as a prophylactic.

## 2020-03-29 ENCOUNTER — Ambulatory Visit
Admission: RE | Admit: 2020-03-29 | Discharge: 2020-03-29 | Disposition: A | Discharge status: Home / Self Care | Payer: No Typology Code available for payment source | Source: Ambulatory Visit | Attending: Neurosurgery | Admitting: Neurosurgery

## 2020-03-29 ENCOUNTER — Other Ambulatory Visit: Payer: Self-pay

## 2020-03-29 DIAGNOSIS — M5440 Lumbago with sciatica, unspecified side: Secondary | ICD-10-CM

## 2020-03-29 MED ORDER — ONDANSETRON 4 MG PO TBDP
4.0000 mg | ORAL_TABLET | Freq: Once | ORAL | Status: AC
  Administered 2020-03-29: 4 mg via ORAL

## 2020-03-29 MED ORDER — DIAZEPAM 5 MG PO TABS
10.0000 mg | ORAL_TABLET | Freq: Once | ORAL | Status: AC
  Administered 2020-03-29: 10 mg via ORAL

## 2020-03-29 MED ORDER — ONDANSETRON HCL 4 MG/2ML IJ SOLN: 4.0000 mg | Freq: Once | INTRAMUSCULAR | Status: DC

## 2020-03-29 MED ORDER — MEPERIDINE HCL 50 MG/ML IJ SOLN
50.0000 mg | Freq: Once | INTRAMUSCULAR | Status: AC
  Administered 2020-03-29: 50 mg via INTRAMUSCULAR

## 2020-03-29 MED ORDER — IOPAMIDOL (ISOVUE-M 200) INJECTION 41%: 15.0000 mL | Freq: Once | INTRAMUSCULAR | Status: DC

## 2020-03-29 NOTE — Discharge Instructions (Signed)
Myelogram Discharge Instructions  1. Go home and rest quietly for the next 24 hours.  It is important to lie flat for the next 24 hours.  Get up only to go to the restroom.  You may lie in the bed or on a couch on your back, your stomach, your left side or your right side.  You may have one pillow under your head.  You may have pillows between your knees while you are on your side or under your knees while you are on your back.  2. DO NOT drive today.  Recline the seat as far back as it will go, while still wearing your seat belt, on the way home.  3. You may get up to go to the bathroom as needed.  You may sit up for 10 minutes to eat.  You may resume your normal diet and medications unless otherwise indicated.  Drink lots of extra fluids today and tomorrow.  4. The incidence of headache, nausea, or vomiting is about 5% (one in 20 patients).  If you develop a headache, lie flat and drink plenty of fluids until the headache goes away.  Caffeinated beverages may be helpful.  If you develop severe nausea and vomiting or a headache that does not go away with flat bed rest, call [phone].  5. You may resume normal activities after your 24 hours of bed rest is over; however, do not exert yourself strongly or do any heavy lifting tomorrow. If when you get up you have a headache when standing, go back to bed and force fluids for another 24 hours.  6. Call your physician for a follow-up appointment.  The results of your myelogram will be sent directly to your physician by the following day.  7. If you have any questions or if complications develop after you arrive home, please call [phone].  Discharge instructions have been explained to the patient.  The patient, or the person responsible for the patient, fully understands these instructions  YOU MAY RESUME YOUR PROMETHAZINE TOMORROW 03/30/20 AT 0930 AM

## 2020-03-29 NOTE — Discharge Instr - Other Orders (Addendum)
1015: Pt c/o pain 7/10 in BLE from myelogram procedure. See MAR 1042: Pt reports his pain is "better" ranking pain 4/10.

## 2020-03-29 NOTE — Progress Notes (Signed)
Pt reports he has been off of his Promethazine for at least 48 hours for myelogram procedure. Pt also reports n/v when given CT contrast. Pt will be given 4mg  of Zofran prior to myelogram per Dr. .

## 2020-04-22 ENCOUNTER — Encounter (HOSPITAL_COMMUNITY): Payer: Self-pay | Admitting: *Deleted

## 2020-04-22 ENCOUNTER — Other Ambulatory Visit: Payer: Self-pay

## 2020-04-22 ENCOUNTER — Emergency Department (HOSPITAL_COMMUNITY)
Admission: EM | Admit: 2020-04-22 | Discharge: 2020-04-22 | Disposition: A | Discharge status: Home / Self Care | Payer: No Typology Code available for payment source | Attending: Emergency Medicine | Admitting: Emergency Medicine

## 2020-04-22 DIAGNOSIS — K5900 Constipation, unspecified: Secondary | ICD-10-CM | POA: Diagnosis not present

## 2020-04-22 DIAGNOSIS — R112 Nausea with vomiting, unspecified: Secondary | ICD-10-CM | POA: Insufficient documentation

## 2020-04-22 DIAGNOSIS — R109 Unspecified abdominal pain: Secondary | ICD-10-CM | POA: Diagnosis not present

## 2020-04-22 DIAGNOSIS — Z91010 Allergy to peanuts: Secondary | ICD-10-CM | POA: Diagnosis not present

## 2020-04-22 LAB — COMPREHENSIVE METABOLIC PANEL
ALT: 26 U/L (ref 0–44)
AST: 32 U/L (ref 15–41)
Albumin: 4.4 g/dL (ref 3.5–5.0)
Alkaline Phosphatase: 45 U/L (ref 38–126)
Anion gap: 10 (ref 5–15)
BUN: 8 mg/dL (ref 6–20)
CO2: 24 mmol/L (ref 22–32)
Calcium: 9.4 mg/dL (ref 8.9–10.3)
Chloride: 103 mmol/L (ref 98–111)
Creatinine, Ser: 1.23 mg/dL (ref 0.61–1.24)
GFR, Estimated: >60 mL/min (ref >60)
Glucose, Bld: 94 mg/dL (ref 70–99)
Potassium: 3.7 mmol/L (ref 3.5–5.1)
Sodium: 137 mmol/L (ref 135–145)
Total Bilirubin: 1.4 mg/dL — ABNORMAL HIGH (ref 0.3–1.2)
Total Protein: 7.7 g/dL (ref 6.5–8.1)

## 2020-04-22 LAB — CBC
HCT: 46 % (ref 39.0–52.0)
Hemoglobin: 15.6 g/dL (ref 13.0–17.0)
MCH: 29.9 pg (ref 26.0–34.0)
MCHC: 33.9 g/dL (ref 30.0–36.0)
MCV: 88.1 fL (ref 80.0–100.0)
Platelets: 239 10*3/uL (ref 150–400)
RBC: 5.22 MIL/uL (ref 4.22–5.81)
RDW: 12.8 % (ref 11.5–15.5)
WBC: 4.4 10*3/uL (ref 4.0–10.5)
nRBC: 0 % (ref 0.0–0.2)

## 2020-04-22 LAB — LIPASE, BLOOD: Lipase: 27 U/L (ref 11–51)

## 2020-04-22 MED ORDER — ONDANSETRON 4 MG PO TBDP: 4.0000 mg | ORAL_TABLET | Freq: Three times a day (TID) | ORAL | 0 refills | Status: DC | PRN

## 2020-04-22 MED ORDER — ONDANSETRON HCL 4 MG/2ML IJ SOLN
4.0000 mg | Freq: Once | INTRAMUSCULAR | Status: AC
  Administered 2020-04-22: 4 mg via INTRAVENOUS
  Filled 2020-04-22: qty 2

## 2020-04-22 MED ORDER — SODIUM CHLORIDE 0.9 % IV BOLUS
1000.0000 mL | Freq: Once | INTRAVENOUS | Status: AC
  Administered 2020-04-22: 1000 mL via INTRAVENOUS

## 2020-04-22 NOTE — ED Triage Notes (Signed)
Vomiting since 9 pm last night, states he is constipated, last BM yesterday morning. No other symptoms

## 2020-04-22 NOTE — Discharge Instructions (Addendum)
Take the Zofran as needed which may help you with nausea and vomiting.  You can take this instead of the promethazine. Take the medications you have at home to help with your bowel movements.  Make sure you are drinking plenty of fluids and cooperating high-fiber foods into your diet. Follow-up with your primary care provider. Return to the ER if you start to experience worsening vomiting, severe abdominal pain, chest pain, shortness of breath or bloody stools.

## 2020-04-22 NOTE — ED Provider Notes (Signed)
[facility]-EMERGENCY DEPT Provider Note   CSN: [id number] Arrival date & time: 04/22/20  1227     History Chief Complaint  Patient presents with  . Emesis    Terry Dunn is a 35 y.o. male who presents to ED with a chief complaint of vomiting.  Reports several episodes of nonbloody, nonbilious emesis since 9 PM last night.  Had a normal bowel movement about 24 hours ago but no bowel movement since.  Reports some intermittent abdominal pain but is mostly concerned about his vomiting.  Has had colitis in the past with unknown etiology but that typically presents as constipation and severe abdominal pain.  He states that this feels different.  States that prior to symptom onset last night he took a preworkout as well as ate a salad which is not unusual for him.  Has tried promethazine last night and this morning but ended up having vomiting afterwards.  Denies any prior abdominal surgeries.  No bloody stools.  No chest pain, shortness of breath, urinary symptoms, fever, sick contacts with similar symptoms  HPI     History reviewed. No pertinent past medical history.  There are no problems to display for this patient.   Past Surgical History:  Procedure Laterality Date  . BACK SURGERY         Family History  Problem Relation Age of Onset  . Healthy Mother   . Healthy Father     Social History   Tobacco Use  . Smoking status: Never Smoker  . Smokeless tobacco: Never Used  Vaping Use  . Vaping Use: Never used  Substance Use Topics  . Alcohol use: Not Currently  . Drug use: Never    Home Medications Prior to Admission medications   Medication Sig Start Date End Date Taking? Authorizing Provider  ondansetron (ZOFRAN ODT) 4 MG disintegrating tablet Take 1 tablet (4 mg total) by mouth every 8 (eight) hours as needed for nausea or vomiting. 04/22/20  Yes Khatri, Hina, PA-C  bisacodyl (DULCOLAX) 5 MG EC tablet Take 1 tablet (5 mg total) by mouth daily as  needed for moderate constipation. Patient not taking: Reported on 04/22/2020 01/28/20   McDonald, Pedro Earls A, PA-C  cyclobenzaprine (FLEXERIL) 10 MG tablet Take 1 tablet (10 mg total) by mouth 2 (two) times daily as needed for muscle spasms. Patient not taking: Reported on 04/22/2020 12/09/19   Milagros Loll, MD  etodolac (LODINE) 300 MG capsule Take 1 capsule (300 mg total) by mouth every 8 (eight) hours. Patient not taking: Reported on 04/22/2020 11/23/19   Linwood Dibbles, MD  predniSONE (DELTASONE) 50 MG tablet Take 1 tablet (50 mg total) by mouth daily. Patient not taking: No sig reported 11/23/19   Linwood Dibbles, MD  predniSONE (STERAPRED UNI-PAK 21 TAB) 10 MG (21) TBPK tablet Take by mouth daily. Take 6 tabs by mouth daily  for 1 day, then 5 tabs for 1days, then 4 tabs for 1 days, then 3 tabs for 1 days, 2 tabs for 1 days, then 1 tab by mouth daily for 1 days Patient not taking: No sig reported 12/09/19   Milagros Loll, MD    Allergies    Peanut-containing drug products and Contrast media [iodinated diagnostic agents]  Review of Systems   Review of Systems  Constitutional: Negative for appetite change, chills and fever.  HENT: Negative for ear pain, rhinorrhea, sneezing and sore throat.   Eyes: Negative for photophobia and visual disturbance.  Respiratory: Negative for cough,  chest tightness, shortness of breath and wheezing.   Cardiovascular: Negative for chest pain and palpitations.  Gastrointestinal: Positive for abdominal pain, constipation, nausea and vomiting. Negative for blood in stool and diarrhea.  Genitourinary: Negative for dysuria, hematuria and urgency.  Musculoskeletal: Negative for myalgias.  Skin: Negative for rash.  Neurological: Negative for dizziness, weakness and light-headedness.    Physical Exam Updated Vital Signs BP (!) 144/114 (BP Location: Left Arm)   Pulse 94   Temp 98.2 F (36.8 C) (Oral)   Resp 18   Ht 6' (1.829 m)   Wt 108.4 kg   SpO2 100%   BMI 32.41  kg/m   Physical Exam Vitals and nursing note reviewed.  Constitutional:      General: He is not in acute distress.    Appearance: He is well-developed and well-nourished.  HENT:     Head: Normocephalic and atraumatic.     Nose: Nose normal.  Eyes:     General: No scleral icterus.       Right eye: No discharge.        Left eye: No discharge.     Extraocular Movements: EOM normal.     Conjunctiva/sclera: Conjunctivae normal.  Cardiovascular:     Rate and Rhythm: Normal rate and regular rhythm.     Pulses: Intact distal pulses.     Heart sounds: Normal heart sounds. No murmur heard. No friction rub. No gallop.   Pulmonary:     Effort: Pulmonary effort is normal. No respiratory distress.     Breath sounds: Normal breath sounds.  Abdominal:     General: Bowel sounds are normal. There is no distension.     Palpations: Abdomen is soft.     Tenderness: There is no abdominal tenderness. There is no guarding.     Comments: Soft, nontender nondistended.  Musculoskeletal:        General: No edema. Normal range of motion.     Cervical back: Normal range of motion and neck supple.  Skin:    General: Skin is warm and dry.     Findings: No rash.  Neurological:     Mental Status: He is alert.     Motor: No abnormal muscle tone.     Coordination: Coordination normal.  Psychiatric:        Mood and Affect: Mood and affect normal.     ED Results / Procedures / Treatments   Labs (all labs ordered are listed, but only abnormal results are displayed) Labs Reviewed  COMPREHENSIVE METABOLIC PANEL - Abnormal; Notable for the following components:      Result Value   Total Bilirubin 1.4 (*)    All other components within normal limits  LIPASE, BLOOD  CBC    EKG None  Radiology No results found.  Procedures Procedures   Medications Ordered in ED Medications  sodium chloride 0.9 % bolus 1,000 mL (1,000 mLs Intravenous New Bag/Given 04/22/20 1304)  ondansetron (ZOFRAN) injection 4  mg (4 mg Intravenous Given 04/22/20 1305)    ED Course  I have reviewed the triage vital signs and the nursing notes.  Pertinent labs & imaging results that were available during my care of the patient were reviewed by me and considered in my medical decision making (see chart for details).  Clinical Course as of 04/22/20 1406  Fri Apr 22, 2020  1324 Total Bilirubin(!): 1.4 [HK]  1325 AST: 32 [HK]  1325 ALT: 26 [HK]  1325 WBC: 4.4 [HK]  1340 Patient able  to tolerate PO after Zofran. [HK]    Clinical Course User Index [HK] Dietrich Pates, PA-C   MDM Rules/Calculators/A&P                          35 year old male presenting to the ED with a chief complaint of vomiting.  Reports several episodes of nonbloody, nonbilious emesis since 9 PM last night.  Normal bowel movement about 24 hours ago but no bowel movement since then and is concerned he is constipated.  Reports intermittent abdominal pain.  Reports history of colitis with unknown etiology but that typically presents as severe and reports abdominal pain with constipation.  He states that this feels different.  Minimal improvement noted with promethazine taken last night and this morning.  No suspicious food intake, sick contacts with similar symptoms or prior abdominal surgeries.  No bloody stools.  No urinary symptoms, chest pain or shortness of breath.  On exam abdomen is soft, nontender nondistended.  He is not vomiting on arrival.  Lab work here is with unremarkable CMP, CBC and lipase.  Will attempt to control symptoms and hydrate and reassess.  Patient with resolution of symptoms after Zofran.  Able to tolerate p.o. intake without difficulty.  Suspect viral cause of his symptoms.  Repeat abdominal exams remain benign.  I doubt appendicitis, cholecystitis or other emergent cause of his symptoms based on his reassuring work-up and vital signs as well as physical exam findings.  Patient is comfortable with discharge home with continued  antiemetic and increasing his fluid intake.  He does have medications at home that he helps with constipation so can use these as needed.  Return precautions given.   Patient is hemodynamically stable, in NAD, and able to ambulate in the ED. Evaluation does not show pathology that would require ongoing emergent intervention or inpatient treatment. I explained the diagnosis to the patient. Pain has been managed and has no complaints prior to discharge. Patient is comfortable with above plan and is stable for discharge at this time. All questions were answered prior to disposition. Strict return precautions for returning to the ED were discussed. Encouraged follow up with PCP.   An After Visit Summary was printed and given to the patient.   Portions of this note were generated with Scientist, clinical (histocompatibility and immunogenetics). Dictation errors may occur despite best attempts at proofreading.  Final Clinical Impression(s) / ED Diagnoses Final diagnoses:  Non-intractable vomiting with nausea, unspecified vomiting type    Rx / DC Orders ED Discharge Orders         Ordered    ondansetron (ZOFRAN ODT) 4 MG disintegrating tablet  Every 8 hours PRN        04/22/20 1343           Dietrich Pates, PA-C 04/22/20 1406    Cheryll Cockayne, MD 04/23/20 1121

## 2020-05-25 ENCOUNTER — Emergency Department (HOSPITAL_COMMUNITY): Payer: No Typology Code available for payment source

## 2020-05-25 ENCOUNTER — Encounter (HOSPITAL_COMMUNITY): Payer: Self-pay

## 2020-05-25 DIAGNOSIS — Z91010 Allergy to peanuts: Secondary | ICD-10-CM | POA: Diagnosis not present

## 2020-05-25 DIAGNOSIS — K429 Umbilical hernia without obstruction or gangrene: Secondary | ICD-10-CM | POA: Insufficient documentation

## 2020-05-25 DIAGNOSIS — R1033 Periumbilical pain: Secondary | ICD-10-CM | POA: Diagnosis present

## 2020-05-25 LAB — CBC WITH DIFFERENTIAL/PLATELET
Abs Immature Granulocytes: 0.02 10*3/uL (ref 0.00–0.07)
Basophils Absolute: 0 10*3/uL (ref 0.0–0.1)
Basophils Relative: 1 %
Eosinophils Absolute: 0.4 10*3/uL (ref 0.0–0.5)
Eosinophils Relative: 6 %
HCT: 43.2 % (ref 39.0–52.0)
Hemoglobin: 14.7 g/dL (ref 13.0–17.0)
Immature Granulocytes: 0 %
Lymphocytes Relative: 48 %
Lymphs Abs: 3.1 10*3/uL (ref 0.7–4.0)
MCH: 30.4 pg (ref 26.0–34.0)
MCHC: 34 g/dL (ref 30.0–36.0)
MCV: 89.4 fL (ref 80.0–100.0)
Monocytes Absolute: 0.6 10*3/uL (ref 0.1–1.0)
Monocytes Relative: 9 %
Neutro Abs: 2.3 10*3/uL (ref 1.7–7.7)
Neutrophils Relative %: 36 %
Platelets: 264 10*3/uL (ref 150–400)
RBC: 4.83 MIL/uL (ref 4.22–5.81)
RDW: 13.5 % (ref 11.5–15.5)
WBC: 6.4 10*3/uL (ref 4.0–10.5)
nRBC: 0 % (ref 0.0–0.2)

## 2020-05-25 LAB — BASIC METABOLIC PANEL
Anion gap: 9 (ref 5–15)
BUN: 12 mg/dL (ref 6–20)
CO2: 24 mmol/L (ref 22–32)
Calcium: 9.1 mg/dL (ref 8.9–10.3)
Chloride: 105 mmol/L (ref 98–111)
Creatinine, Ser: 1.16 mg/dL (ref 0.61–1.24)
GFR, Estimated: >60 mL/min (ref >60)
Glucose, Bld: 80 mg/dL (ref 70–99)
Potassium: 3.4 mmol/L — ABNORMAL LOW (ref 3.5–5.1)
Sodium: 138 mmol/L (ref 135–145)

## 2020-05-25 MED ORDER — OXYCODONE-ACETAMINOPHEN 5-325 MG PO TABS
1.0000 | ORAL_TABLET | Freq: Once | ORAL | Status: AC
  Administered 2020-05-25: 1 via ORAL
  Filled 2020-05-25: qty 1

## 2020-05-25 NOTE — ED Triage Notes (Signed)
Patient arrived with a known hernia over the last year, stating about three hours ago began having pain. Declines any NVD

## 2020-05-25 NOTE — ED Provider Notes (Signed)
MSE was initiated and I personally evaluated the patient and placed orders (if any) at  10:18 PM on May 25, 2020.  Patient with known periumbilical hernia presents with sharp pain associated with the hernia today. No fever or vomiting. Last BM this am.   Today's Vitals   05/25/20 2212  BP: 138/89  Pulse: 80  Temp: 98.4 F (36.9 C)  TempSrc: Oral  SpO2: 100%   There is no height or weight on file to calculate BMI.  Abd soft, tender to left of umbilicus. No mass on exam while patient sitting up in triage. Limited exam.   The patient appears stable so that the remainder of the MSE may be completed by another provider.   Elpidio Anis, PA-C 05/25/20 2221    Derwood Kaplan, MD 05/26/20 [phone]

## 2020-05-26 ENCOUNTER — Emergency Department (HOSPITAL_COMMUNITY)
Admission: EM | Admit: 2020-05-26 | Discharge: 2020-05-26 | Disposition: A | Discharge status: Home / Self Care | Payer: No Typology Code available for payment source | Attending: Emergency Medicine | Admitting: Emergency Medicine

## 2020-05-26 ENCOUNTER — Emergency Department (HOSPITAL_COMMUNITY): Payer: No Typology Code available for payment source

## 2020-05-26 DIAGNOSIS — K429 Umbilical hernia without obstruction or gangrene: Secondary | ICD-10-CM

## 2020-05-26 MED ORDER — OXYCODONE-ACETAMINOPHEN 5-325 MG PO TABS
1.0000 | ORAL_TABLET | Freq: Once | ORAL | Status: AC
  Administered 2020-05-26: 1 via ORAL
  Filled 2020-05-26: qty 1

## 2020-05-26 MED ORDER — OXYCODONE-ACETAMINOPHEN 5-325 MG PO TABS: 1.0000 | ORAL_TABLET | ORAL | 0 refills | Status: DC | PRN

## 2020-05-26 NOTE — Discharge Instructions (Addendum)
Take percocet as needed for pain. Recommend ibuprofen 600 mg (3 over-the-counter tablets) as well for additional relief.   [facility] for elective repair of the hernia. Return to the ED if you develop any high fever, severe pain or new concern.

## 2020-05-26 NOTE — ED Provider Notes (Signed)
[facility]-EMERGENCY DEPT Provider Note   CSN: [id number] Arrival date & time: 05/25/20  2156     History Chief Complaint  Patient presents with  . Hernia    Terry Dunn is a 35 y.o. male.  Patient to ED with complaint of periumbilical abdominal pain at the site of a known hernia. Pain started yesterday (05/25/20). No fever, nausea or vomiting. Last BM was yesterday morning and was normal, non-bloody.   The history is provided by the patient. No language interpreter was used.       History reviewed. No pertinent past medical history.  There are no problems to display for this patient.   Past Surgical History:  Procedure Laterality Date  . BACK SURGERY         Family History  Problem Relation Age of Onset  . Healthy Mother   . Healthy Father     Social History   Tobacco Use  . Smoking status: Never Smoker  . Smokeless tobacco: Never Used  Vaping Use  . Vaping Use: Never used  Substance Use Topics  . Alcohol use: Not Currently  . Drug use: Never    Home Medications Prior to Admission medications   Medication Sig Start Date End Date Taking? Authorizing Provider  bisacodyl (DULCOLAX) 5 MG EC tablet Take 1 tablet (5 mg total) by mouth daily as needed for moderate constipation. Patient not taking: Reported on 04/22/2020 01/28/20   McDonald, Pedro Earls A, PA-C  cyclobenzaprine (FLEXERIL) 10 MG tablet Take 1 tablet (10 mg total) by mouth 2 (two) times daily as needed for muscle spasms. Patient not taking: Reported on 04/22/2020 12/09/19   Milagros Loll, MD  etodolac (LODINE) 300 MG capsule Take 1 capsule (300 mg total) by mouth every 8 (eight) hours. Patient not taking: Reported on 04/22/2020 11/23/19   Linwood Dibbles, MD  ondansetron (ZOFRAN ODT) 4 MG disintegrating tablet Take 1 tablet (4 mg total) by mouth every 8 (eight) hours as needed for nausea or vomiting. 04/22/20   Khatri, Hina, PA-C  predniSONE (DELTASONE) 50 MG tablet Take 1 tablet (50 mg total)  by mouth daily. Patient not taking: No sig reported 11/23/19   Linwood Dibbles, MD  predniSONE (STERAPRED UNI-PAK 21 TAB) 10 MG (21) TBPK tablet Take by mouth daily. Take 6 tabs by mouth daily  for 1 day, then 5 tabs for 1days, then 4 tabs for 1 days, then 3 tabs for 1 days, 2 tabs for 1 days, then 1 tab by mouth daily for 1 days Patient not taking: No sig reported 12/09/19   Milagros Loll, MD    Allergies    Peanut-containing drug products and Contrast media [iodinated diagnostic agents]  Review of Systems   Review of Systems  Constitutional: Negative for chills and fever.  HENT: Negative.   Respiratory: Negative.   Cardiovascular: Negative.   Gastrointestinal: Positive for abdominal pain. Negative for nausea and vomiting.  Genitourinary: Negative.   Musculoskeletal: Negative.   Skin: Negative.   Neurological: Negative.     Physical Exam Updated Vital Signs BP 138/89 (BP Location: Left Arm)   Pulse 80   Temp 98.4 F (36.9 C) (Oral)   SpO2 100%   Physical Exam Vitals and nursing note reviewed.  Constitutional:      Appearance: He is well-developed.  Pulmonary:     Effort: Pulmonary effort is normal.  Abdominal:     General: There is no distension.     Palpations: Abdomen is soft. There is  no mass.     Tenderness: There is abdominal tenderness (Significant tenderness on left umbilical abdomen. No palpable umbilical or periumbilical mass. ).  Musculoskeletal:        General: Normal range of motion.     Cervical back: Normal range of motion.  Skin:    General: Skin is warm and dry.  Neurological:     Mental Status: He is alert and oriented to person, place, and time.     ED Results / Procedures / Treatments   Labs (all labs ordered are listed, but only abnormal results are displayed) Labs Reviewed  BASIC METABOLIC PANEL - Abnormal; Notable for the following components:      Result Value   Potassium 3.4 (*)    All other components within normal limits  CBC WITH  DIFFERENTIAL/PLATELET   Results for orders placed or performed during the hospital encounter of 05/26/20  CBC with Differential  Result Value Ref Range   WBC 6.4 4.0 - 10.5 K/uL   RBC 4.83 4.22 - 5.81 MIL/uL   Hemoglobin 14.7 13.0 - 17.0 g/dL   HCT 06.2 37.6 - 28.3 %   MCV 89.4 80.0 - 100.0 fL   MCH 30.4 26.0 - 34.0 pg   MCHC 34.0 30.0 - 36.0 g/dL   RDW 15.1 76.1 - 60.7 %   Platelets 264 150 - 400 K/uL   nRBC 0.0 0.0 - 0.2 %   Neutrophils Relative % 36 %   Neutro Abs 2.3 1.7 - 7.7 K/uL   Lymphocytes Relative 48 %   Lymphs Abs 3.1 0.7 - 4.0 K/uL   Monocytes Relative 9 %   Monocytes Absolute 0.6 0.1 - 1.0 K/uL   Eosinophils Relative 6 %   Eosinophils Absolute 0.4 0.0 - 0.5 K/uL   Basophils Relative 1 %   Basophils Absolute 0.0 0.0 - 0.1 K/uL   Immature Granulocytes 0 %   Abs Immature Granulocytes 0.02 0.00 - 0.07 K/uL  Basic metabolic panel  Result Value Ref Range   Sodium 138 135 - 145 mmol/L   Potassium 3.4 (L) 3.5 - 5.1 mmol/L   Chloride 105 98 - 111 mmol/L   CO2 24 22 - 32 mmol/L   Glucose, Bld 80 70 - 99 mg/dL   BUN 12 6 - 20 mg/dL   Creatinine, Ser 3.71 0.61 - 1.24 mg/dL   Calcium 9.1 8.9 - 06.2 mg/dL   GFR, Estimated >69 >48 mL/min   Anion gap 9 5 - 15    EKG None  Radiology DG ABD ACUTE 2+V W 1V CHEST  Result Date: 05/25/2020 CLINICAL DATA:  Abdominal pain EXAM: DG ABDOMEN ACUTE WITH 1 VIEW CHEST COMPARISON:  None. FINDINGS: There is no evidence of dilated bowel loops or free intraperitoneal air. No radiopaque calculi or other significant radiographic abnormality is seen. Moderate amount of right colonic stool is seen. Heart size and mediastinal contours are within normal limits. Both lungs are clear. IMPRESSION: Moderate amount of right colonic stool. Nonobstructive bowel gas pattern. No acute cardiopulmonary disease. Electronically Signed   By: Jonna Clark M.D.   On: 05/25/2020 22:55    Procedures Procedures   Medications Ordered in ED Medications   oxyCODONE-acetaminophen (PERCOCET/ROXICET) 5-325 MG per tablet 1 tablet (1 tablet Oral Given 05/25/20 2236)    ED Course  I have reviewed the triage vital signs and the nursing notes.  Pertinent labs & imaging results that were available during my care of the patient were reviewed by me and considered in  my medical decision making (see chart for details).    MDM Rules/Calculators/A&P                          Patient to ED with pain at known area of periumbilical hernia x 1 day. No fever, N, V.   Labs unremarkable, no fever, no evidence of acute process. Plain film shows normal BGP. On recheck, he continues to be tender in the periumbilical region. CT ordered to eval for incarceration.   CT showing fat containing hernia. Patient updated on results. He can follow up outpatient for elective hernia repair. [facility]. Return precautions discussed.   Final Clinical Impression(s) / ED Diagnoses Final diagnoses:  None   1. Periumbilical hernia   Rx / DC Orders ED Discharge Orders    None       Elpidio Anis, PA-C 05/26/20 1505    Milagros Loll, MD 05/27/20 [phone]

## 2020-05-26 NOTE — ED Notes (Signed)
Patient transported to CT 

## 2020-05-26 NOTE — ED Notes (Signed)
Pt is requesting ice chips at this time. Asked to wait to eat or drink anything until we got the results of his CT scan. Pt alert and oriented.

## 2020-08-10 ENCOUNTER — Encounter (HOSPITAL_BASED_OUTPATIENT_CLINIC_OR_DEPARTMENT_OTHER): Payer: Self-pay | Admitting: Obstetrics and Gynecology

## 2020-08-10 ENCOUNTER — Other Ambulatory Visit: Payer: Self-pay

## 2020-08-10 DIAGNOSIS — R1033 Periumbilical pain: Secondary | ICD-10-CM | POA: Diagnosis present

## 2020-08-10 DIAGNOSIS — Z91010 Allergy to peanuts: Secondary | ICD-10-CM | POA: Diagnosis not present

## 2020-08-10 NOTE — ED Triage Notes (Signed)
Patient reports to the ER for for abdominal pain with recent hernia surgery. Patient reports he had an episode of emesis last night. Patient endorses nausea. Patient states he called his surgeon and was told to go to the nearest ER. No signs of changes to the surgical site.

## 2020-08-11 ENCOUNTER — Emergency Department (HOSPITAL_BASED_OUTPATIENT_CLINIC_OR_DEPARTMENT_OTHER)
Admission: EM | Admit: 2020-08-11 | Discharge: 2020-08-11 | Disposition: A | Discharge status: Home / Self Care | Payer: No Typology Code available for payment source | Attending: Emergency Medicine | Admitting: Emergency Medicine

## 2020-08-11 ENCOUNTER — Emergency Department (HOSPITAL_BASED_OUTPATIENT_CLINIC_OR_DEPARTMENT_OTHER): Payer: No Typology Code available for payment source

## 2020-08-11 DIAGNOSIS — R109 Unspecified abdominal pain: Secondary | ICD-10-CM

## 2020-08-11 LAB — CBC WITH DIFFERENTIAL/PLATELET
Abs Immature Granulocytes: 0.03 10*3/uL (ref 0.00–0.07)
Basophils Absolute: 0 10*3/uL (ref 0.0–0.1)
Basophils Relative: 1 %
Eosinophils Absolute: 0.4 10*3/uL (ref 0.0–0.5)
Eosinophils Relative: 6 %
HCT: 45.2 % (ref 39.0–52.0)
Hemoglobin: 15.3 g/dL (ref 13.0–17.0)
Immature Granulocytes: 1 %
Lymphocytes Relative: 42 %
Lymphs Abs: 2.4 10*3/uL (ref 0.7–4.0)
MCH: 29.9 pg (ref 26.0–34.0)
MCHC: 33.8 g/dL (ref 30.0–36.0)
MCV: 88.3 fL (ref 80.0–100.0)
Monocytes Absolute: 0.4 10*3/uL (ref 0.1–1.0)
Monocytes Relative: 7 %
Neutro Abs: 2.4 10*3/uL (ref 1.7–7.7)
Neutrophils Relative %: 43 %
Platelets: 282 10*3/uL (ref 150–400)
RBC: 5.12 MIL/uL (ref 4.22–5.81)
RDW: 12.9 % (ref 11.5–15.5)
WBC: 5.7 10*3/uL (ref 4.0–10.5)
nRBC: 0 % (ref 0.0–0.2)

## 2020-08-11 LAB — COMPREHENSIVE METABOLIC PANEL
ALT: 23 U/L (ref 0–44)
AST: 16 U/L (ref 15–41)
Albumin: 4.3 g/dL (ref 3.5–5.0)
Alkaline Phosphatase: 40 U/L (ref 38–126)
Anion gap: 8 (ref 5–15)
BUN: 9 mg/dL (ref 6–20)
CO2: 28 mmol/L (ref 22–32)
Calcium: 9.9 mg/dL (ref 8.9–10.3)
Chloride: 105 mmol/L (ref 98–111)
Creatinine, Ser: 0.99 mg/dL (ref 0.61–1.24)
GFR, Estimated: >60 mL/min (ref >60)
Glucose, Bld: 84 mg/dL (ref 70–99)
Potassium: 3.7 mmol/L (ref 3.5–5.1)
Sodium: 141 mmol/L (ref 135–145)
Total Bilirubin: 0.6 mg/dL (ref 0.3–1.2)
Total Protein: 6.7 g/dL (ref 6.5–8.1)

## 2020-08-11 LAB — LIPASE, BLOOD: Lipase: 48 U/L (ref 11–51)

## 2020-08-11 LAB — LACTIC ACID, PLASMA: Lactic Acid, Venous: 1.2 mmol/L (ref 0.5–1.9)

## 2020-08-11 MED ORDER — HYDROMORPHONE HCL 1 MG/ML IJ SOLN
1.0000 mg | Freq: Once | INTRAMUSCULAR | Status: AC
  Administered 2020-08-11: 1 mg via INTRAVENOUS
  Filled 2020-08-11: qty 1

## 2020-08-11 MED ORDER — SODIUM CHLORIDE 0.9 % IV BOLUS
1000.0000 mL | Freq: Once | INTRAVENOUS | Status: AC
  Administered 2020-08-11: 1000 mL via INTRAVENOUS

## 2020-08-11 NOTE — ED Provider Notes (Signed)
[facility] EMERGENCY DEPT Provider Note  CSN: [id number] Arrival date & time: 08/10/20 2155  Chief Complaint(s) Abdominal Pain  HPI Terry Dunn is a 35 y.o. male status post hernia repair with mesh placement in the ventral region 1 week ago here for persistent anterior abdominal wall pain.   Abdominal Pain Pain location:  Periumbilical (abd wall) Pain quality: burning   Pain radiates to:  Does not radiate Pain severity:  Moderate Onset quality:  Gradual Duration:  1 week Timing:  Constant  No nausea or vomiting. No fevers or chills. No coughing or congestion. Still having bowel movements and passing gas.  Past Medical History History reviewed. No pertinent past medical history. There are no problems to display for this patient.  Home Medication(s) Prior to Admission medications   Medication Sig Start Date End Date Taking? Authorizing Provider  bisacodyl (DULCOLAX) 5 MG EC tablet Take 1 tablet (5 mg total) by mouth daily as needed for moderate constipation. Patient not taking: Reported on 04/22/2020 01/28/20   McDonald, Liala Codispoti Earls A, PA-C  cyclobenzaprine (FLEXERIL) 10 MG tablet Take 1 tablet (10 mg total) by mouth 2 (two) times daily as needed for muscle spasms. Patient not taking: Reported on 04/22/2020 12/09/19   Milagros Loll, MD  etodolac (LODINE) 300 MG capsule Take 1 capsule (300 mg total) by mouth every 8 (eight) hours. Patient not taking: Reported on 04/22/2020 11/23/19   Linwood Dibbles, MD  ondansetron (ZOFRAN ODT) 4 MG disintegrating tablet Take 1 tablet (4 mg total) by mouth every 8 (eight) hours as needed for nausea or vomiting. 04/22/20   Khatri, Hina, PA-C  oxyCODONE-acetaminophen (PERCOCET/ROXICET) 5-325 MG tablet Take 1 tablet by mouth every 4 (four) hours as needed for severe pain. 05/26/20   Elpidio Anis, PA-C  predniSONE (DELTASONE) 50 MG tablet Take 1 tablet (50 mg total) by mouth daily. Patient not taking: No sig reported 11/23/19   Linwood Dibbles, MD   predniSONE (STERAPRED UNI-PAK 21 TAB) 10 MG (21) TBPK tablet Take by mouth daily. Take 6 tabs by mouth daily  for 1 day, then 5 tabs for 1days, then 4 tabs for 1 days, then 3 tabs for 1 days, 2 tabs for 1 days, then 1 tab by mouth daily for 1 days Patient not taking: No sig reported 12/09/19   Milagros Loll, MD                                                                                                                                    Past Surgical History Past Surgical History:  Procedure Laterality Date   BACK SURGERY     Family History Family History  Problem Relation Age of Onset   Healthy Mother    Healthy Father     Social History Social History   Tobacco Use   Smoking status: Never   Smokeless tobacco: Never  Vaping Use   Vaping Use: Never used  Substance Use Topics   Alcohol use: Yes    Comment: Social   Drug use: Never   Allergies Peanut-containing drug products and Contrast media [iodinated diagnostic agents]  Review of Systems Review of Systems  Gastrointestinal:  Positive for abdominal pain.  All other systems are reviewed and are negative for acute change except as noted in the HPI  Physical Exam Vital Signs  I have reviewed the triage vital signs BP (!) 146/93 (BP Location: Right Arm)   Pulse 85   Temp 97.7 F (36.5 C) (Oral)   Resp 19   Ht 6' (1.829 m)   Wt 106.4 kg   SpO2 100%   BMI 31.82 kg/m   Physical Exam Vitals reviewed.  Constitutional:      General: He is not in acute distress.    Appearance: He is well-developed. He is not diaphoretic.  HENT:     Head: Normocephalic and atraumatic.     Right Ear: External ear normal.     Left Ear: External ear normal.     Nose: Nose normal.     Mouth/Throat:     Mouth: Mucous membranes are moist.  Eyes:     General: No scleral icterus.    Conjunctiva/sclera: Conjunctivae normal.  Neck:     Trachea: Phonation normal.  Cardiovascular:     Rate and Rhythm: Normal rate and regular  rhythm.  Pulmonary:     Effort: Pulmonary effort is normal. No respiratory distress.     Breath sounds: No stridor.  Abdominal:     General: There is no distension.     Tenderness: There is abdominal tenderness (to abd wall itself to noted areas, no tenderness with deep palpation).       Comments: Numerous trochar incisions, c/d/I. No surrounding erythema or underlying induration  Musculoskeletal:        General: Normal range of motion.     Cervical back: Normal range of motion.  Neurological:     Mental Status: He is alert and oriented to person, place, and time.  Psychiatric:        Behavior: Behavior normal.    ED Results and Treatments Labs (all labs ordered are listed, but only abnormal results are displayed) Labs Reviewed  CBC WITH DIFFERENTIAL/PLATELET  COMPREHENSIVE METABOLIC PANEL  LIPASE, BLOOD  LACTIC ACID, PLASMA                                                                                                                         EKG  EKG Interpretation  Date/Time:    Ventricular Rate:    PR Interval:    QRS Duration:   QT Interval:    QTC Calculation:   R Axis:     Text Interpretation:          Radiology CT ABDOMEN PELVIS WO CONTRAST  Result Date: 08/11/2020 CLINICAL DATA:  Abdominal wall pain. Recent hernia surgery 1 week ago. EXAM: CT ABDOMEN AND PELVIS WITHOUT CONTRAST TECHNIQUE: Multidetector CT  imaging of the abdomen and pelvis was performed following the standard protocol without IV contrast. COMPARISON:  CT abdomen pelvis 05/26/2020 FINDINGS: Lower chest: Bilateral lower lobe subsegmental atelectasis. Hepatobiliary: No focal liver abnormality. No gallstones, gallbladder wall thickening, or pericholecystic fluid. No biliary dilatation. Pancreas: No focal lesion. Normal pancreatic contour. No surrounding inflammatory changes. No main pancreatic ductal dilatation. Spleen: Normal in size without focal abnormality. Adrenals/Urinary Tract: No adrenal  nodule bilaterally. No nephrolithiasis, no hydronephrosis, and no contour-deforming renal mass. No ureterolithiasis or hydroureter. The urinary bladder is unremarkable. Stomach/Bowel: Stomach is within normal limits. No evidence of bowel wall thickening or dilatation. Scattered colonic diverticulosis. Appendix appears normal. Vascular/Lymphatic: No significant vascular findings are present. No enlarged abdominal or pelvic lymph nodes. Reproductive: Prostate is unremarkable. Other: No intraperitoneal free fluid. No intraperitoneal free gas. No organized fluid collection. Musculoskeletal: Status post ventral wall hernia repair with mesh with persistent versus recurrent left supraumbilical ventral wall hernia formation containing trace free fluid and fat at the superior most aspect of the mesh tacks-similar position as prior (6:63, 2:39). Interval development of trace mesenteric fat stranding as well as stranding along the periumbilical subcutaneus soft tissues. Healed left upper abdomen laparoscopic site (2:29). No suspicious lytic or blastic osseous lesions. No acute displaced fracture. Multilevel degenerative changes of the spine. Interbody disc spacer at the L5-S1 level. IMPRESSION: 1. Status post ventral wall hernia repair with mesh with persistent versus recurrent tiny left supraumbilical ventral wall hernia formation containing trace free fluid and omental fat. Hernia is noted to be located at the superior most aspect of the mesh tacks. 2. Interval development of trace mesenteric fat stranding as well as stranding along the periumbilical subcutaneus soft tissues consistent with postsurgical changes in the setting of a laparoscopic surgery 1 week ago. Differential diagnosis includes infection- correlate clinically. These results were called by telephone at the time of interpretation on 08/11/2020 at 3:02 am to provider [facility] , who verbally acknowledged these results. Electronically Signed   By: Tish Frederickson M.D.   On: 08/11/2020 03:02    Pertinent labs & imaging results that were available during my care of the patient were reviewed by me and considered in my medical decision making (see chart for details).  Medications Ordered in ED Medications  HYDROmorphone (DILAUDID) injection 1 mg (1 mg Intravenous Given 08/11/20 0148)  sodium chloride 0.9 % bolus 1,000 mL (0 mLs Intravenous Stopped 08/11/20 0310)                                                                                                                                    Procedures Procedures  (including critical care time)  Medical Decision Making / ED Course I have reviewed the nursing notes for this encounter and the patient's prior records (if available in EHR or on provided paperwork).   Jerry Clyne was evaluated in Emergency Department on 08/11/2020 for the symptoms described in  the history of present illness. He was evaluated in the context of the global COVID-19 pandemic, which necessitated consideration that the patient might be at risk for infection with the SARS-CoV-2 virus that causes COVID-19. Institutional protocols and algorithms that pertain to the evaluation of patients at risk for COVID-19 are in a state of rapid change based on information released by regulatory bodies including the CDC and federal and state organizations. These policies and algorithms were followed during the patient's care in the ED.  Work up negative for postop infection.  No bowel obstruction.  Labs reassuring without leukocytosis or anemia.  No significant electrolyte derangements or renal sufficiency.  Patient treated symptomatically with IV pain medicine and fluids.  CT scan still reveals persistence ventral omental fat hernia.      Final Clinical Impression(s) / ED Diagnoses Final diagnoses:  Abdominal wall pain   The patient appears reasonably screened and/or stabilized for discharge and I doubt any other medical condition or  other [facility] requiring further screening, evaluation, or treatment in the ED at this time prior to discharge. Safe for discharge with strict return precautions.  Disposition: Discharge  Condition: Good  I have discussed the results, Dx and Tx plan with the patient/family who expressed understanding and agree(s) with the plan. Discharge instructions discussed at length. The patient/family was given strict return precautions who verbalized understanding of the instructions. No further questions at time of discharge.    ED Discharge Orders     None       Follow Up: [facility], [facility] [address] [facility] [place] [address] [phone]  Call  to schedule an appointment for close follow up      This chart was dictated using voice recognition software.  Despite best efforts to proofread,  errors can occur which can change the documentation meaning.    Nira Conn, MD 08/11/20 [phone]

## 2021-01-11 IMAGING — CR DG HIP (WITH OR WITHOUT PELVIS) 2-3V*L*
3 series · 3 of 3 positions shown · non-contrast
Comparison: None.

CLINICAL DATA: Left hip and low back pain

EXAM:
DG HIP (WITH OR WITHOUT PELVIS) 2-3V LEFT

[t hip ap left (1 of 2)]
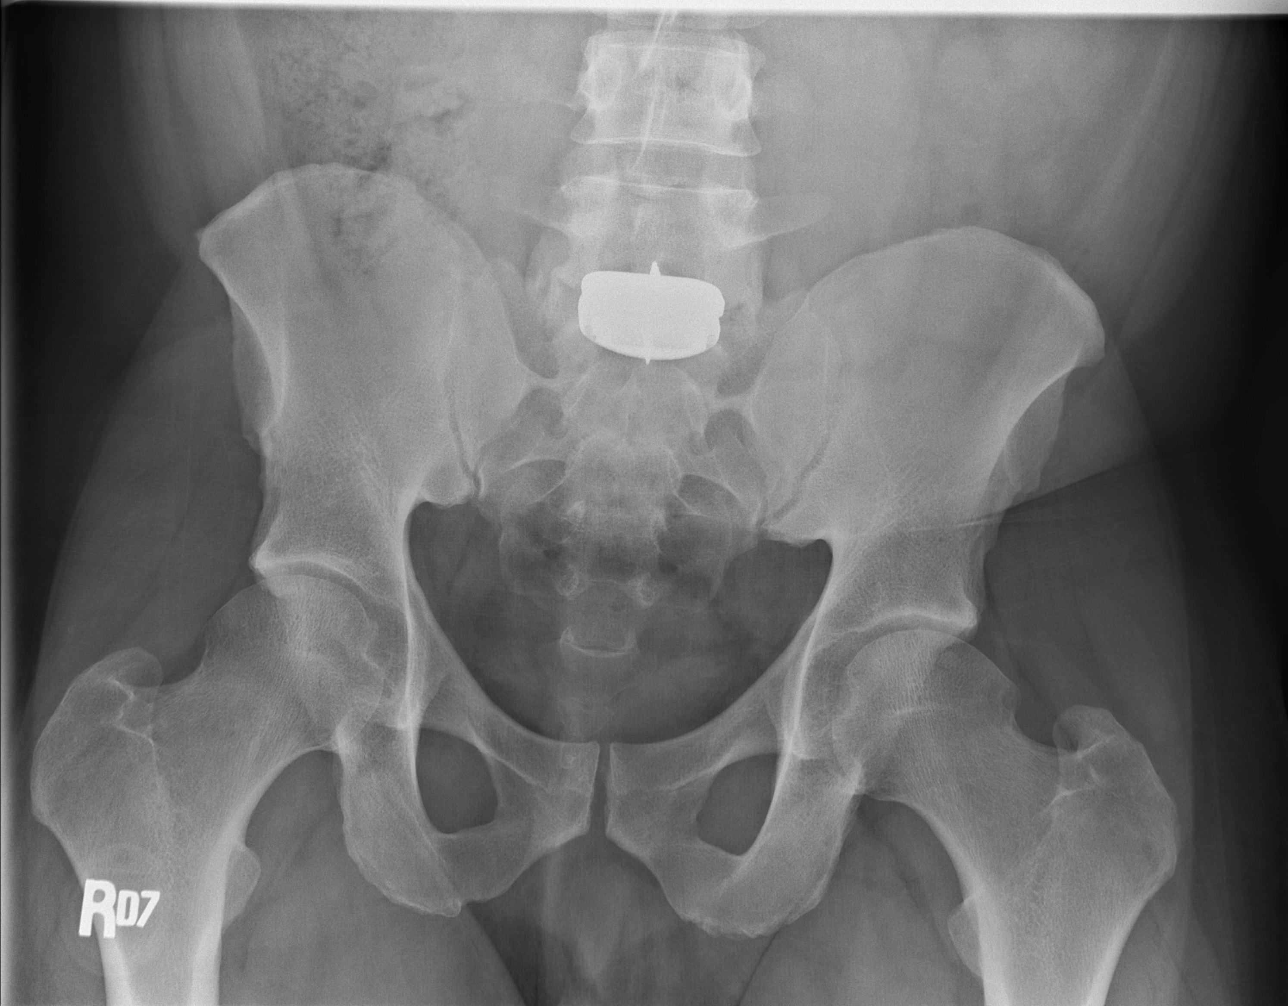

[t hip ap left (2 of 2)]
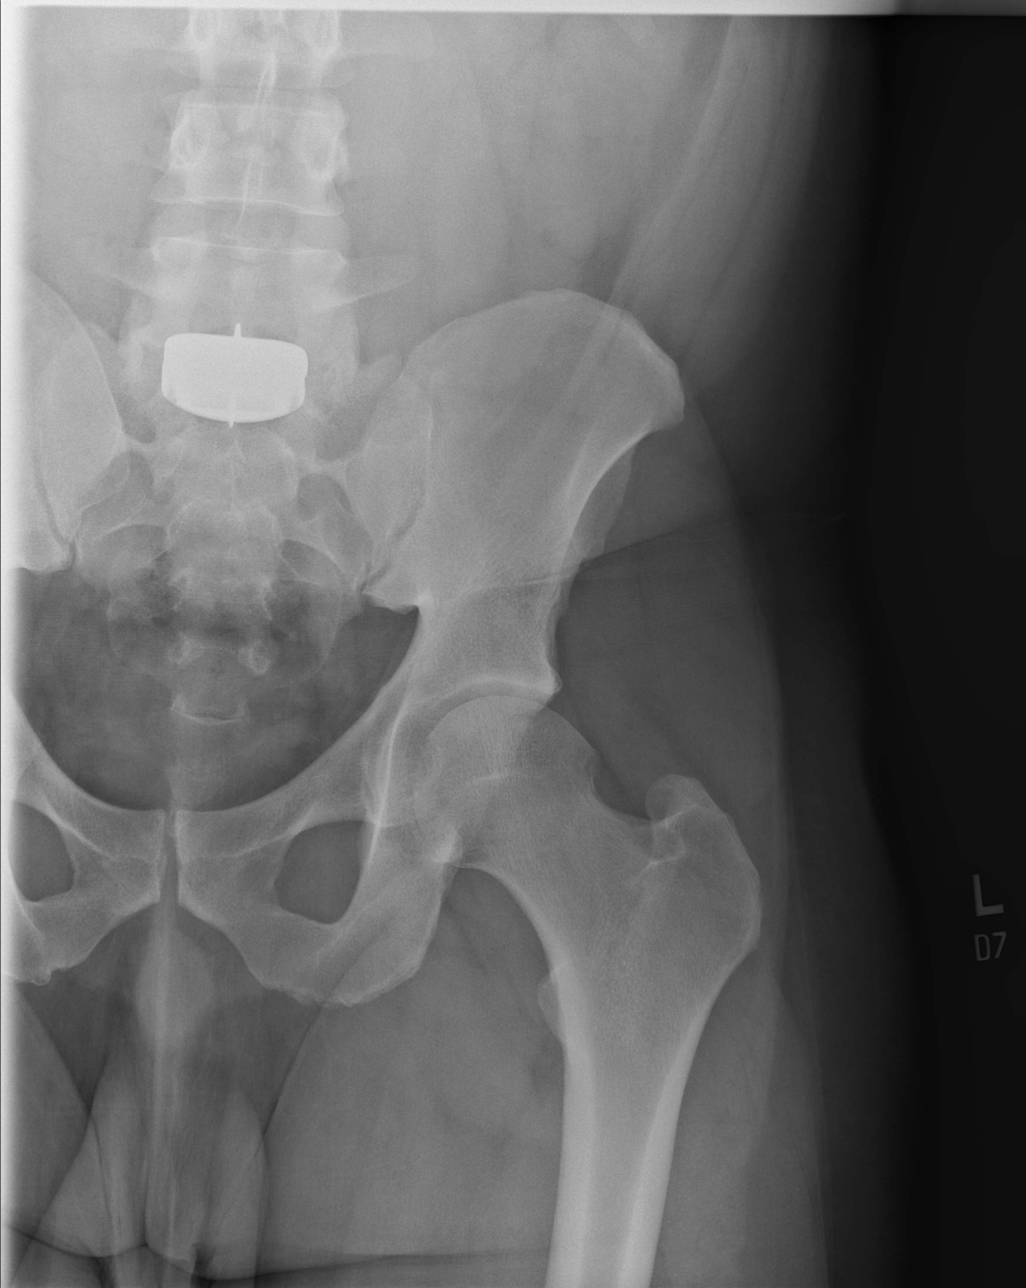

[t hip frog leg left]
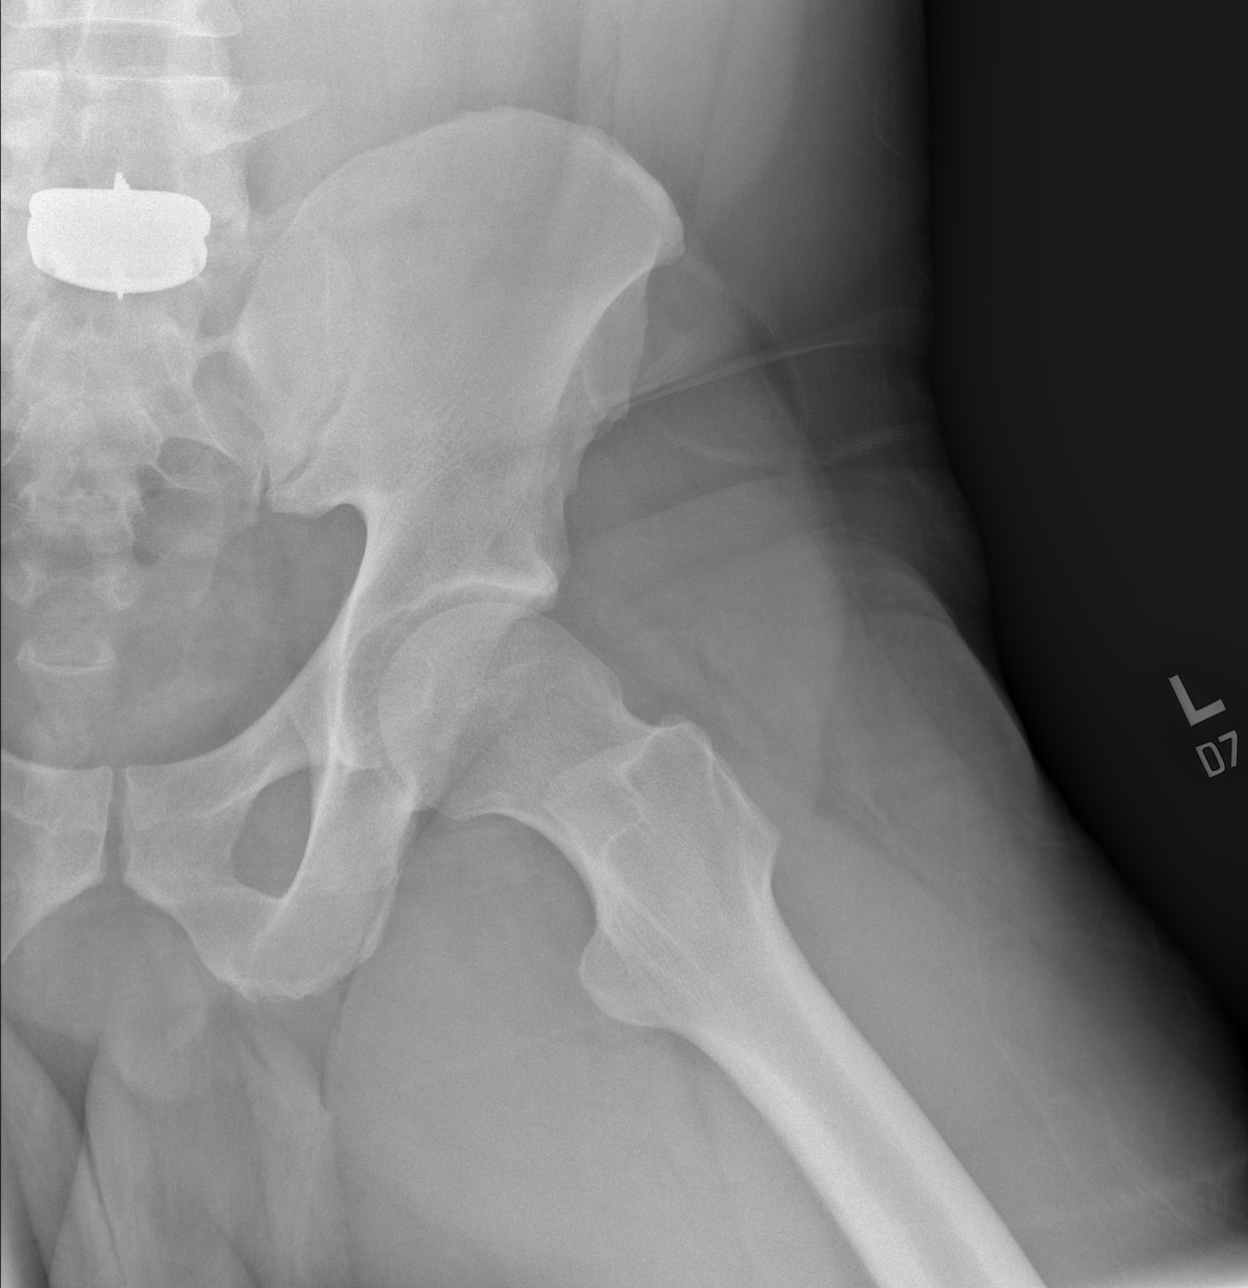

[3 of 3 positions shown; findings below may reference images not displayed]

FINDINGS: Postoperative changes noted at the lumbosacral junction. Hip joints
and SI joints are symmetric and unremarkable. No acute bony
abnormality. Specifically, no fracture, subluxation, or dislocation.
IMPRESSION: No acute bony abnormality.

## 2021-08-18 ENCOUNTER — Emergency Department (HOSPITAL_COMMUNITY)
Admission: EM | Admit: 2021-08-18 | Discharge: 2021-08-18 | Disposition: A | Discharge status: Home / Self Care | Payer: No Typology Code available for payment source | Attending: Emergency Medicine | Admitting: Emergency Medicine

## 2021-08-18 ENCOUNTER — Encounter (HOSPITAL_COMMUNITY): Payer: Self-pay

## 2021-08-18 ENCOUNTER — Emergency Department (HOSPITAL_COMMUNITY): Payer: No Typology Code available for payment source

## 2021-08-18 DIAGNOSIS — K439 Ventral hernia without obstruction or gangrene: Secondary | ICD-10-CM | POA: Insufficient documentation

## 2021-08-18 DIAGNOSIS — R109 Unspecified abdominal pain: Secondary | ICD-10-CM

## 2021-08-18 LAB — URINALYSIS, ROUTINE W REFLEX MICROSCOPIC
Bilirubin Urine: NEGATIVE
Glucose, UA: NEGATIVE mg/dL
Hgb urine dipstick: NEGATIVE
Ketones, ur: NEGATIVE mg/dL
Leukocytes,Ua: NEGATIVE
Nitrite: NEGATIVE
Protein, ur: NEGATIVE mg/dL
Specific Gravity, Urine: 1.018 (ref 1.005–1.030)
pH: 6 (ref 5.0–8.0)

## 2021-08-18 LAB — COMPREHENSIVE METABOLIC PANEL
ALT: 27 U/L (ref 0–44)
AST: 20 U/L (ref 15–41)
Albumin: 4.3 g/dL (ref 3.5–5.0)
Alkaline Phosphatase: 46 U/L (ref 38–126)
Anion gap: 8 (ref 5–15)
BUN: 13 mg/dL (ref 6–20)
CO2: 26 mmol/L (ref 22–32)
Calcium: 9.2 mg/dL (ref 8.9–10.3)
Chloride: 106 mmol/L (ref 98–111)
Creatinine, Ser: 1.01 mg/dL (ref 0.61–1.24)
GFR, Estimated: >60 mL/min (ref >60)
Glucose, Bld: 103 mg/dL — ABNORMAL HIGH (ref 70–99)
Potassium: 3.6 mmol/L (ref 3.5–5.1)
Sodium: 140 mmol/L (ref 135–145)
Total Bilirubin: 1.1 mg/dL (ref 0.3–1.2)
Total Protein: 7.5 g/dL (ref 6.5–8.1)

## 2021-08-18 LAB — CBC WITH DIFFERENTIAL/PLATELET
Abs Immature Granulocytes: 0.02 10*3/uL (ref 0.00–0.07)
Basophils Absolute: 0 10*3/uL (ref 0.0–0.1)
Basophils Relative: 1 %
Eosinophils Absolute: 0.2 10*3/uL (ref 0.0–0.5)
Eosinophils Relative: 3 %
HCT: 42.3 % (ref 39.0–52.0)
Hemoglobin: 14.8 g/dL (ref 13.0–17.0)
Immature Granulocytes: 0 %
Lymphocytes Relative: 51 %
Lymphs Abs: 2.7 10*3/uL (ref 0.7–4.0)
MCH: 31.5 pg (ref 26.0–34.0)
MCHC: 35 g/dL (ref 30.0–36.0)
MCV: 90 fL (ref 80.0–100.0)
Monocytes Absolute: 0.4 10*3/uL (ref 0.1–1.0)
Monocytes Relative: 7 %
Neutro Abs: 2 10*3/uL (ref 1.7–7.7)
Neutrophils Relative %: 38 %
Platelets: 284 10*3/uL (ref 150–400)
RBC: 4.7 MIL/uL (ref 4.22–5.81)
RDW: 13.2 % (ref 11.5–15.5)
WBC: 5.4 10*3/uL (ref 4.0–10.5)
nRBC: 0 % (ref 0.0–0.2)

## 2021-08-18 LAB — LACTIC ACID, PLASMA: Lactic Acid, Venous: 0.7 mmol/L (ref 0.5–1.9)

## 2021-08-18 MED ORDER — DOCUSATE SODIUM 100 MG PO CAPS: 100.0000 mg | ORAL_CAPSULE | Freq: Two times a day (BID) | ORAL | 0 refills | Status: DC

## 2021-08-18 MED ORDER — METHOCARBAMOL 500 MG PO TABS: 500.0000 mg | ORAL_TABLET | Freq: Two times a day (BID) | ORAL | 0 refills | Status: DC

## 2021-08-18 MED ORDER — OXYCODONE-ACETAMINOPHEN 5-325 MG PO TABS: 1.0000 | ORAL_TABLET | Freq: Four times a day (QID) | ORAL | 0 refills | Status: DC | PRN

## 2021-08-18 MED ORDER — OXYCODONE-ACETAMINOPHEN 5-325 MG PO TABS
2.0000 | ORAL_TABLET | Freq: Once | ORAL | Status: AC
  Administered 2021-08-18: 2 via ORAL
  Filled 2021-08-18: qty 2

## 2021-08-18 NOTE — ED Triage Notes (Signed)
Pt arrived via POV, c/o left sided abd pain. States he has a hernia, had apt with surgery, but apt day got pushed back. Pain worsening significantly today.

## 2021-08-18 NOTE — ED Provider Triage Note (Signed)
Emergency Medicine Provider Triage Evaluation Note  Terry Dunn , a 36 y.o. male  was evaluated in triage.  Pt complains of mid abdominal pain.  Patient has a history of a hernia status postrepair.  He states that he lifted something heavy 4 days ago and has had increased pain and is felt a bulge in his mid abdomen since that time.  He had an episode of vomiting today.  He is having bowel movements.  Review of Systems  Positive: Abdominal pain, vomiting Negative: Fever, constipation  Physical Exam  BP (!) 151/98 (BP Location: Right Arm)   Pulse 82   Temp 99.1 F (37.3 C) (Oral)   Resp 18   Ht 6' (1.829 m)   Wt 104.9 kg   SpO2 100%   BMI 31.36 kg/m  Gen:   Awake, no distress   Resp:  Normal effort  MSK:   Moves extremities without difficulty  Other:  Mid abdominal tenderness to palpation just superior and left of the umbilicus area  Medical Decision Making  Medically screening exam initiated at 5:20 PM.  Appropriate orders placed.  Terry Dunn was informed that the remainder of the evaluation will be completed by another provider, this initial triage assessment does not replace that evaluation, and the importance of remaining in the ED until their evaluation is complete.     Renne Crigler, PA-C 08/18/21 1721

## 2021-08-18 NOTE — ED Provider Notes (Signed)
[facility]-EMERGENCY DEPT Provider Note   CSN: [id number] Arrival date & time: 08/18/21  1638     History  Chief Complaint  Patient presents with   Abdominal Pain    Terry Dunn is a 36 y.o. male.  36 year old male presents with abdominal discomfort that began after he lifted a heavy object.  History of ventral hernia repair in the past.  Has not had any emesis.  No fever or chills.  Pain is characterized as sharp and worse with any movement.  No dark or bloody stools.  No treatment use prior to arrival       Home Medications Prior to Admission medications   Medication Sig Start Date End Date Taking? Authorizing Provider  bisacodyl (DULCOLAX) 5 MG EC tablet Take 1 tablet (5 mg total) by mouth daily as needed for moderate constipation. Patient not taking: Reported on 04/22/2020 01/28/20   McDonald, Pedro Earls A, PA-C  cyclobenzaprine (FLEXERIL) 10 MG tablet Take 1 tablet (10 mg total) by mouth 2 (two) times daily as needed for muscle spasms. Patient not taking: Reported on 04/22/2020 12/09/19   Milagros Loll, MD  etodolac (LODINE) 300 MG capsule Take 1 capsule (300 mg total) by mouth every 8 (eight) hours. Patient not taking: Reported on 04/22/2020 11/23/19   Linwood Dibbles, MD  ondansetron (ZOFRAN ODT) 4 MG disintegrating tablet Take 1 tablet (4 mg total) by mouth every 8 (eight) hours as needed for nausea or vomiting. 04/22/20   Khatri, Hina, PA-C  oxyCODONE-acetaminophen (PERCOCET/ROXICET) 5-325 MG tablet Take 1 tablet by mouth every 4 (four) hours as needed for severe pain. 05/26/20   Elpidio Anis, PA-C  predniSONE (DELTASONE) 50 MG tablet Take 1 tablet (50 mg total) by mouth daily. Patient not taking: No sig reported 11/23/19   Linwood Dibbles, MD  predniSONE (STERAPRED UNI-PAK 21 TAB) 10 MG (21) TBPK tablet Take by mouth daily. Take 6 tabs by mouth daily  for 1 day, then 5 tabs for 1days, then 4 tabs for 1 days, then 3 tabs for 1 days, 2 tabs for 1 days, then 1 tab by mouth  daily for 1 days Patient not taking: No sig reported 12/09/19   Milagros Loll, MD      Allergies    Peanut-containing drug products and Contrast media [iodinated contrast media]    Review of Systems   Review of Systems  All other systems reviewed and are negative.   Physical Exam Updated Vital Signs BP (!) 148/96   Pulse 76   Temp 99.1 F (37.3 C) (Oral)   Resp 18   Ht 1.829 m (6')   Wt 104.9 kg   SpO2 100%   BMI 31.36 kg/m  Physical Exam Vitals and nursing note reviewed.  Constitutional:      General: He is not in acute distress.    Appearance: Normal appearance. He is well-developed. He is not toxic-appearing.  HENT:     Head: Normocephalic and atraumatic.  Eyes:     General: Lids are normal.     Conjunctiva/sclera: Conjunctivae normal.     Pupils: Pupils are equal, round, and reactive to light.  Neck:     Thyroid: No thyroid mass.     Trachea: No tracheal deviation.  Cardiovascular:     Rate and Rhythm: Normal rate and regular rhythm.     Heart sounds: Normal heart sounds. No murmur heard.    No gallop.  Pulmonary:     Effort: Pulmonary effort is normal. No  respiratory distress.     Breath sounds: Normal breath sounds. No stridor. No decreased breath sounds, wheezing, rhonchi or rales.  Abdominal:     General: There is no distension.     Palpations: Abdomen is soft.     Tenderness: There is no abdominal tenderness. There is no rebound.       Comments: No peritoneal signs  Musculoskeletal:        General: No tenderness. Normal range of motion.     Cervical back: Normal range of motion and neck supple.  Skin:    General: Skin is warm and dry.     Findings: No abrasion or rash.  Neurological:     Mental Status: He is alert and oriented to person, place, and time. Mental status is at baseline.     GCS: GCS eye subscore is 4. GCS verbal subscore is 5. GCS motor subscore is 6.     Cranial Nerves: No cranial nerve deficit.     Sensory: No sensory  deficit.     Motor: Motor function is intact.  Psychiatric:        Attention and Perception: Attention normal.        Speech: Speech normal.        Behavior: Behavior normal.     ED Results / Procedures / Treatments   Labs (all labs ordered are listed, but only abnormal results are displayed) Labs Reviewed  COMPREHENSIVE METABOLIC PANEL - Abnormal; Notable for the following components:      Result Value   Glucose, Bld 103 (*)    All other components within normal limits  CBC WITH DIFFERENTIAL/PLATELET  URINALYSIS, ROUTINE W REFLEX MICROSCOPIC  LACTIC ACID, PLASMA    EKG None  Radiology CT ABDOMEN PELVIS WO CONTRAST  Result Date: 08/18/2021 CLINICAL DATA:  Mid abdominal pain. History of a hernia, status post repair. Patient reports that he lifted something heavy 4 days ago and has had increased pain felt a bulge in his mid abdomen since that time. EXAM: CT ABDOMEN AND PELVIS WITHOUT CONTRAST TECHNIQUE: Multidetector CT imaging of the abdomen and pelvis was performed following the standard protocol without IV contrast. RADIATION DOSE REDUCTION: This exam was performed according to the departmental dose-optimization program which includes automated exposure control, adjustment of the mA and/or kV according to patient size and/or use of iterative reconstruction technique. COMPARISON:  08/11/2020. FINDINGS: Lower chest: Clear lung bases. Hepatobiliary: No focal liver abnormality is seen. No gallstones, gallbladder wall thickening, or biliary dilatation. Pancreas: Unremarkable. No pancreatic ductal dilatation or surrounding inflammatory changes. Spleen: Normal in size without focal abnormality. Adrenals/Urinary Tract: Normal adrenal glands. Kidneys normal in size, orientation and position. Small nonobstructing stone in the lower pole of the left kidney. No other intrarenal stones and no masses. No hydronephrosis. Normal ureters. Normal bladder. Stomach/Bowel: Stomach is within normal limits.  Appendix appears normal. No evidence of bowel wall thickening, distention, or inflammatory changes. Vascular/Lymphatic: No significant vascular findings are present. No enlarged abdominal or pelvic lymph nodes. Reproductive: Unremarkable. Other: There is a small fat containing hernia lies just above the superior margin of the ventral hernia mesh. This measures 1.4 x 1.2 cm transversely, and was suggested on the previous CT, but is slightly larger and more defined. No other hernia.  Hernia repair mesh is stable. No ascites. Musculoskeletal: No fracture or acute finding. No bone lesion. Stable artificial disc at L5-S1. IMPRESSION: 1. Small, 1.4 x 1.2 cm, fat containing ventral hernia just superior to the hernia mesh and  just to left of midline, suggested on the previous CT, but slightly larger more defined currently. There is no adjacent inflammation. 2. No other abdominal wall hernia.  Stable hernia repair mesh. 3. No acute findings within the abdomen or pelvis. Electronically Signed   By: Amie Portland M.D.   On: 08/18/2021 18:05    Procedures Procedures    Medications Ordered in ED Medications - No data to display  ED Course/ Medical Decision Making/ A&P                           Medical Decision Making  Patient's laboratory studies showed no evidence of infection.  Abdominal CT shows a small fat-containing ventral hernia without evidence of surrounding inflammation per my interpretation.  Patient has no evidence of incarceration.  Patient will be treated symptomatically with analgesics and muscle axis.  Instructed to follow-up with the surgeon        Final Clinical Impression(s) / ED Diagnoses Final diagnoses:  None    Rx / DC Orders ED Discharge Orders     None         Lorre Nick, MD 08/18/21 2038

## 2021-09-14 IMAGING — CT CT ABD-PELV W/O CM
2 of 4 series · 15 of 46 positions shown, 17 images · non-contrast
Comparison: CT abdomen pelvis 05/26/2020

CLINICAL DATA: Abdominal wall pain. Recent hernia surgery 1 week
ago.

EXAM:
CT ABDOMEN AND PELVIS WITHOUT CONTRAST
TECHNIQUE: Multidetector CT imaging of the abdomen and pelvis was performed
following the standard protocol without IV contrast.

[Series 2: abd pel wo · axial · 0.78mm/px · z∈[-1103,-643]mm · 12 of 102 slices shown, 14 images]
[im 5/102  soft-tissue]
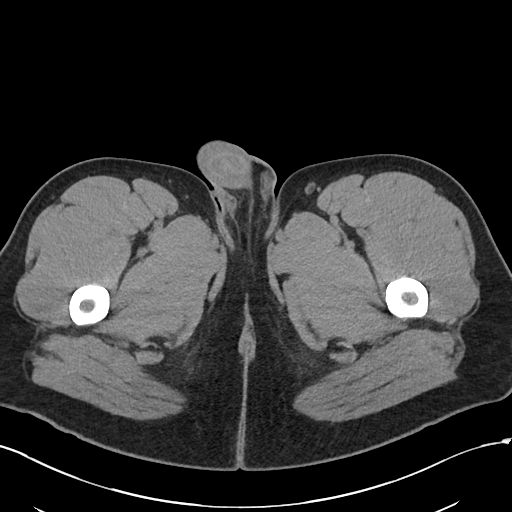
[im 5/102  bone]
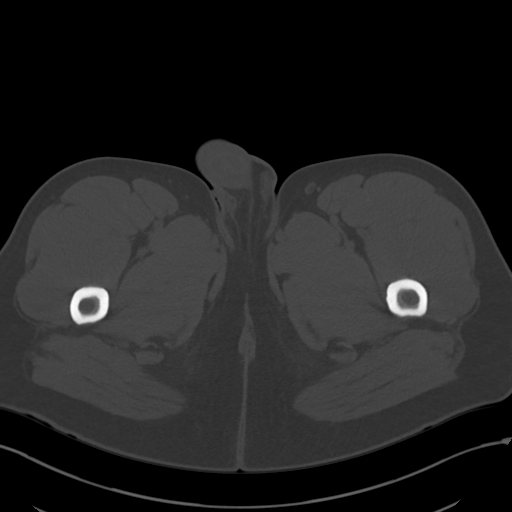
[im 13/102  soft-tissue]
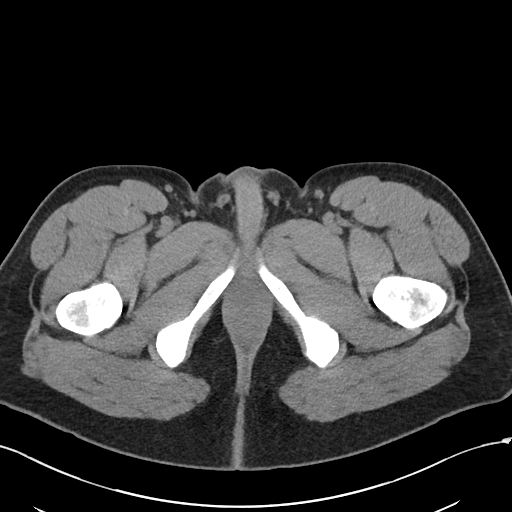
[im 22/102  soft-tissue]
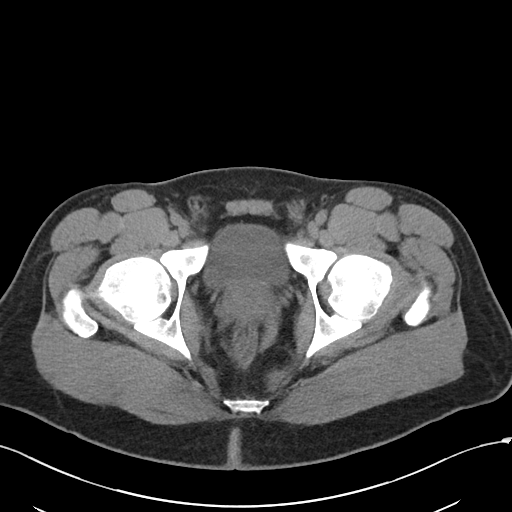
[im 30/102  soft-tissue]
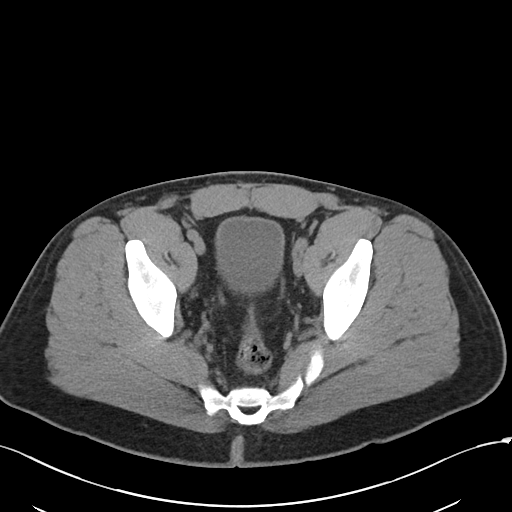
[im 38/102  soft-tissue]
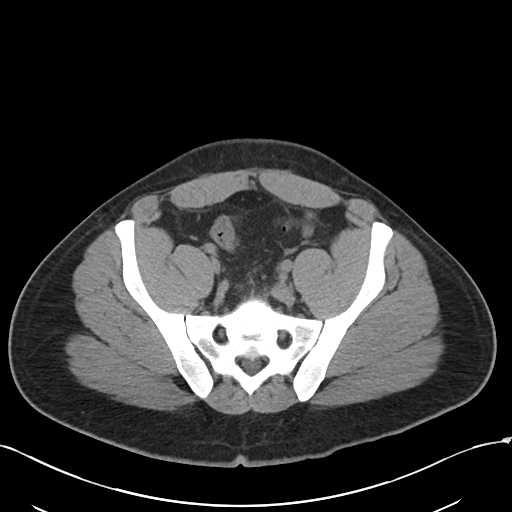
[im 47/102  soft-tissue]
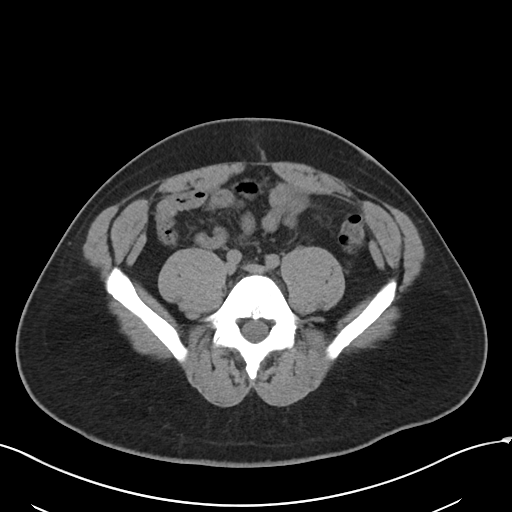
[im 55/102  soft-tissue]
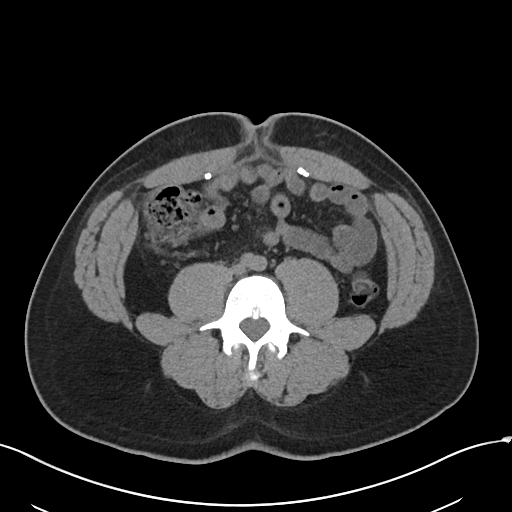
[im 64/102  soft-tissue]
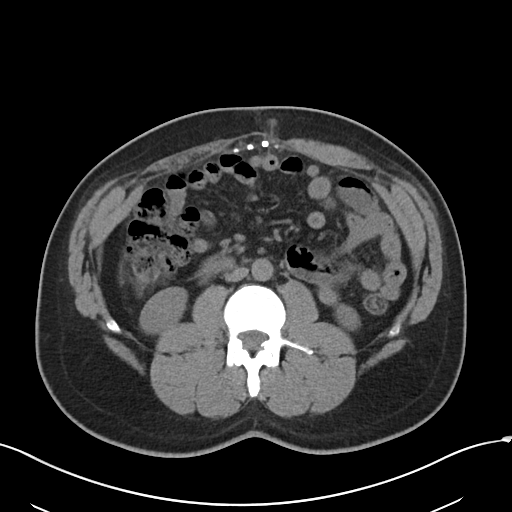
[im 72/102  soft-tissue]
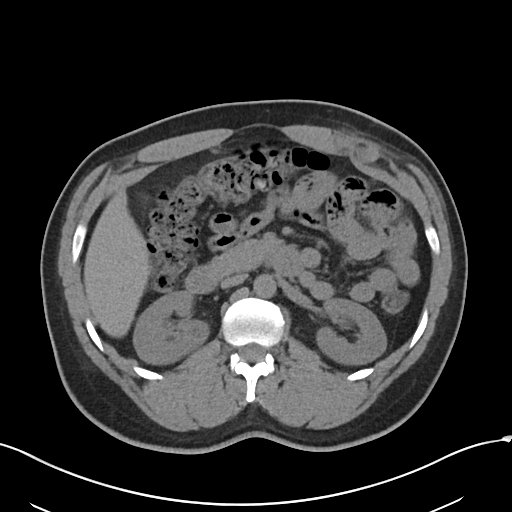
[im 72/102  bone]
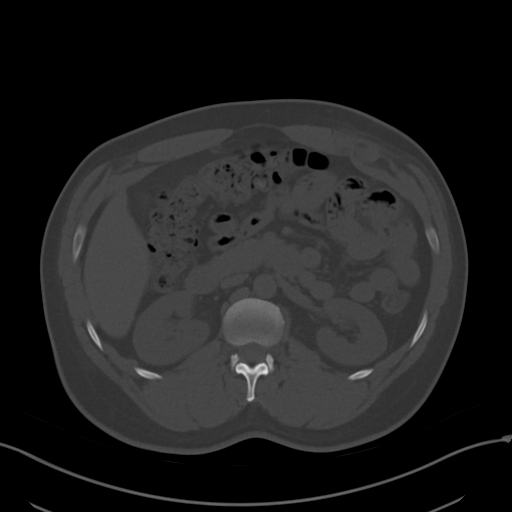
[im 80/102  soft-tissue]
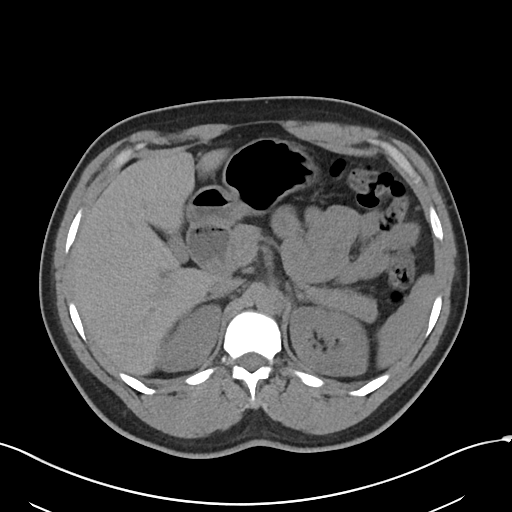
[im 89/102  soft-tissue]
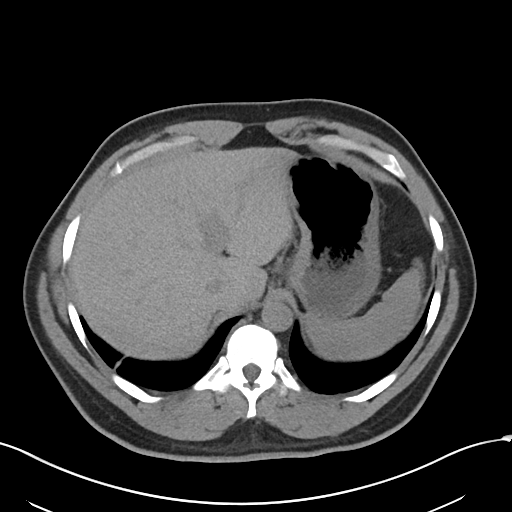
[im 97/102  soft-tissue]
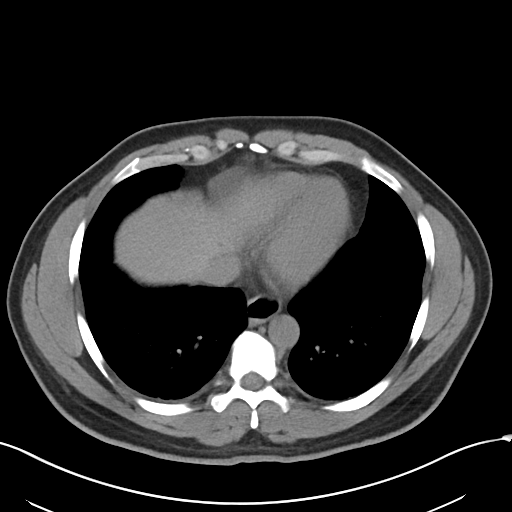

[Series 5: coronal · coronal · 0.76mm/px · 3 of 91 slices shown]
[im 31/91  soft-tissue]
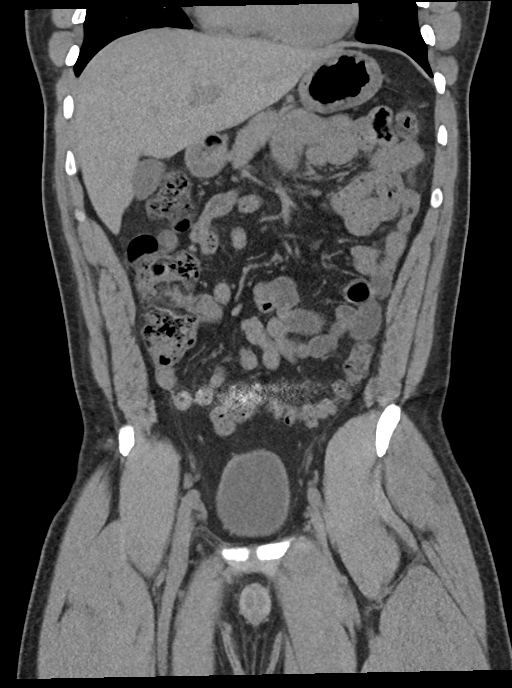
[im 41/91  soft-tissue]
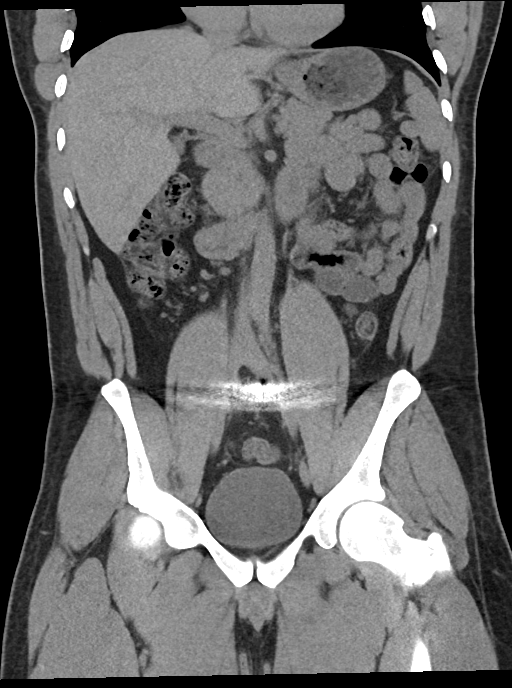
[im 51/91  soft-tissue]
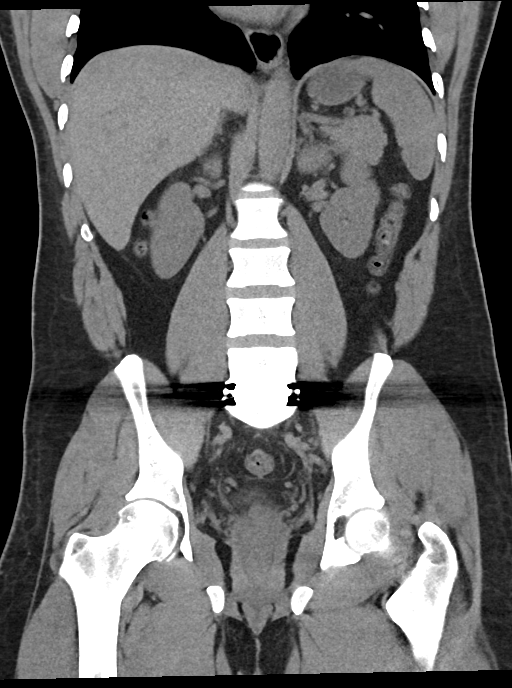

[15 of 46 positions shown; findings below may reference images not displayed]

FINDINGS: Lower chest: Bilateral lower lobe subsegmental atelectasis.

Hepatobiliary: No focal liver abnormality. No gallstones,
gallbladder wall thickening, or pericholecystic fluid. No biliary
dilatation.

Pancreas: No focal lesion. Normal pancreatic contour. No surrounding
inflammatory changes. No main pancreatic ductal dilatation.

Spleen: Normal in size without focal abnormality.

Adrenals/Urinary Tract:

No adrenal nodule bilaterally.

No nephrolithiasis, no hydronephrosis, and no contour-deforming
renal mass. No ureterolithiasis or hydroureter.

The urinary bladder is unremarkable.

Stomach/Bowel: Stomach is within normal limits. No evidence of bowel
wall thickening or dilatation. Scattered colonic diverticulosis.
Appendix appears normal.

Vascular/Lymphatic: No significant vascular findings are present. No
enlarged abdominal or pelvic lymph nodes.

Reproductive: Prostate is unremarkable.

Other: No intraperitoneal free fluid. No intraperitoneal free gas.
No organized fluid collection.

Musculoskeletal:

Status post ventral wall hernia repair with mesh with persistent
versus recurrent left supraumbilical ventral wall hernia formation
containing trace free fluid and fat at the superior most aspect of
the mesh tacks-similar position as prior ([DATE], [DATE]).

Interval development of trace mesenteric fat stranding as well as
stranding along the periumbilical subcutaneus soft tissues. Healed
left upper abdomen laparoscopic site ([DATE]).

No suspicious lytic or blastic osseous lesions. No acute displaced
fracture. Multilevel degenerative changes of the spine. Interbody
disc spacer at the L5-S1 level.
IMPRESSION: 1. Status post ventral wall hernia repair with mesh with persistent
versus recurrent tiny left supraumbilical ventral wall hernia
formation containing trace free fluid and omental fat. Hernia is
noted to be located at the superior most aspect of the mesh tacks.
2. Interval development of trace mesenteric fat stranding as well as
stranding along the periumbilical subcutaneus soft tissues
consistent with postsurgical changes in the setting of a
laparoscopic surgery 1 week ago. Differential diagnosis includes
infection- correlate clinically.

These results were called by telephone at the time of interpretation
on 08/11/2020 at [DATE] to provider ABIMELK TIGER , who verbally
acknowledged these results.

## 2021-11-04 ENCOUNTER — Encounter (HOSPITAL_COMMUNITY): Payer: Self-pay

## 2021-11-04 ENCOUNTER — Emergency Department (HOSPITAL_COMMUNITY)
Admission: EM | Admit: 2021-11-04 | Discharge: 2021-11-04 | Disposition: A | Discharge status: Home / Self Care | Payer: No Typology Code available for payment source | Attending: Emergency Medicine | Admitting: Emergency Medicine

## 2021-11-04 ENCOUNTER — Other Ambulatory Visit: Payer: Self-pay

## 2021-11-04 DIAGNOSIS — R040 Epistaxis: Secondary | ICD-10-CM | POA: Diagnosis present

## 2021-11-04 DIAGNOSIS — Z91010 Allergy to peanuts: Secondary | ICD-10-CM | POA: Diagnosis not present

## 2021-11-04 MED ORDER — OXYMETAZOLINE HCL 0.05 % NA SOLN
1.0000 | Freq: Once | NASAL | Status: AC
  Administered 2021-11-04: 1 via NASAL
  Filled 2021-11-04: qty 30

## 2021-11-04 NOTE — Discharge Instructions (Signed)
If bleeding returns, blow your nose to remove all of the clot.  Spray Afrin to both sides of your nose.  Squeeze the soft part of your nose shut, hold constant pressure for 15 minutes.. Try not to sneeze, blow your nose, rub your nose for the next day or so as this may restart bleeding. Follow-up with ENT or see your doctor if bleeding continues.  Turn to ER for bleeding that cannot be controlled.

## 2021-11-04 NOTE — ED Provider Notes (Signed)
[facility] DEPT Provider Note   CSN: [id number] Arrival date & time: 11/04/21  0800     History  Chief Complaint  Patient presents with   Epistaxis    Terry Dunn is a 36 y.o. male.  36 year old male presents with concern for epistaxis with bleeding from both sides of the nose onset this morning upon waking around 7 AM.  Patient shoved toilet paper in his nose, bleeding continued which caused him to present to the ER.  Bleeding controlled on arrival.  Patient is not on any blood thinners.  Denies any recent trauma to his nose.  States that he does get nosebleeds from time to time.       Home Medications Prior to Admission medications   Medication Sig Start Date End Date Taking? Authorizing Provider  bisacodyl (DULCOLAX) 5 MG EC tablet Take 1 tablet (5 mg total) by mouth daily as needed for moderate constipation. Patient not taking: Reported on 04/22/2020 01/28/20   McDonald, Maree Erie A, PA-C  cyclobenzaprine (FLEXERIL) 10 MG tablet Take 1 tablet (10 mg total) by mouth 2 (two) times daily as needed for muscle spasms. Patient not taking: Reported on 04/22/2020 12/09/19   Lucrezia Starch, MD  docusate sodium (COLACE) 100 MG capsule Take 1 capsule (100 mg total) by mouth every 12 (twelve) hours. 08/18/21   Lacretia Leigh, MD  etodolac (LODINE) 300 MG capsule Take 1 capsule (300 mg total) by mouth every 8 (eight) hours. Patient not taking: Reported on 04/22/2020 11/23/19   Dorie Rank, MD  methocarbamol (ROBAXIN) 500 MG tablet Take 1 tablet (500 mg total) by mouth 2 (two) times daily. 08/18/21   Lacretia Leigh, MD  ondansetron (ZOFRAN ODT) 4 MG disintegrating tablet Take 1 tablet (4 mg total) by mouth every 8 (eight) hours as needed for nausea or vomiting. 04/22/20   Khatri, Hina, PA-C  oxyCODONE-acetaminophen (PERCOCET/ROXICET) 5-325 MG tablet Take 1 tablet by mouth every 4 (four) hours as needed for severe pain. 05/26/20   Charlann Lange, PA-C   oxyCODONE-acetaminophen (PERCOCET/ROXICET) 5-325 MG tablet Take 1-2 tablets by mouth every 6 (six) hours as needed for severe pain. 08/18/21   Lacretia Leigh, MD  predniSONE (DELTASONE) 50 MG tablet Take 1 tablet (50 mg total) by mouth daily. Patient not taking: No sig reported 11/23/19   Dorie Rank, MD  predniSONE (STERAPRED UNI-PAK 21 TAB) 10 MG (21) TBPK tablet Take by mouth daily. Take 6 tabs by mouth daily  for 1 day, then 5 tabs for 1days, then 4 tabs for 1 days, then 3 tabs for 1 days, 2 tabs for 1 days, then 1 tab by mouth daily for 1 days Patient not taking: No sig reported 12/09/19   Lucrezia Starch, MD      Allergies    Peanut-containing drug products and Contrast media [iodinated contrast media]    Review of Systems   Review of Systems Negative except as per HPI Physical Exam Updated Vital Signs BP (!) 149/98 (BP Location: Right Arm)   Pulse (!) 105   Temp 98.3 F (36.8 C) (Oral)   Resp 16   Ht 6' (1.829 m)   Wt 104.8 kg   SpO2 98%   BMI 31.33 kg/m  Physical Exam Vitals and nursing note reviewed.  Constitutional:      General: He is not in acute distress.    Appearance: He is well-developed. He is not diaphoretic.  HENT:     Head: Normocephalic and atraumatic.  Nose:     Comments: No blood in left nostril.  Trace blood in right nostril, no active bleeding. Pulmonary:     Effort: Pulmonary effort is normal.  Skin:    General: Skin is warm and dry.     Findings: No erythema or rash.  Neurological:     Mental Status: He is alert and oriented to person, place, and time.  Psychiatric:        Behavior: Behavior normal.     ED Results / Procedures / Treatments   Labs (all labs ordered are listed, but only abnormal results are displayed) Labs Reviewed - No data to display  EKG None  Radiology No results found.  Procedures Procedures    Medications Ordered in ED Medications  oxymetazoline (AFRIN) 0.05 % nasal spray 1 spray (has no administration  in time range)    ED Course/ Medical Decision Making/ A&P                           Medical Decision Making Risk OTC drugs.   36 year old male with concern for epistaxis as above.  Found to have dried blood in right nostril, no active bleeding.  Discussed management at home, will provide with Afrin and referral to ENT if bleeding continues with return to ER precautions provided.        Final Clinical Impression(s) / ED Diagnoses Final diagnoses:  Epistaxis    Rx / DC Orders ED Discharge Orders     None         Jeannie Fend, PA-C 11/04/21 [id number]    Jacalyn Lefevre, MD 11/04/21 1432

## 2021-11-04 NOTE — ED Triage Notes (Signed)
Patient is having bilateral epistaxis and states that it started in his sleep between 0500 and 0800 today. Patient has toilet tissue in both nostrils.

## 2022-11-03 ENCOUNTER — Other Ambulatory Visit: Payer: Self-pay

## 2022-11-03 ENCOUNTER — Emergency Department (HOSPITAL_COMMUNITY)
Admission: EM | Admit: 2022-11-03 | Discharge: 2022-11-04 | Disposition: A | Discharge status: Home / Self Care | Payer: No Typology Code available for payment source | Attending: Emergency Medicine | Admitting: Emergency Medicine

## 2022-11-03 ENCOUNTER — Encounter (HOSPITAL_COMMUNITY): Payer: Self-pay | Admitting: Emergency Medicine

## 2022-11-03 DIAGNOSIS — Y9301 Activity, walking, marching and hiking: Secondary | ICD-10-CM | POA: Diagnosis not present

## 2022-11-03 DIAGNOSIS — W228XXA Striking against or struck by other objects, initial encounter: Secondary | ICD-10-CM | POA: Diagnosis not present

## 2022-11-03 DIAGNOSIS — S0990XA Unspecified injury of head, initial encounter: Secondary | ICD-10-CM | POA: Insufficient documentation

## 2022-11-03 DIAGNOSIS — Z91010 Allergy to peanuts: Secondary | ICD-10-CM | POA: Insufficient documentation

## 2022-11-03 NOTE — ED Triage Notes (Signed)
Patient coming to ED for evaluation of possible head injury.  Reports he was walking in the dark 2 nights ago and ran into a door.  Hit forehead.  No LOC.  C/o HA and nausea.  Hx of concussion "years ago."  No reports of visual changes.

## 2022-11-04 ENCOUNTER — Emergency Department (HOSPITAL_COMMUNITY): Payer: No Typology Code available for payment source

## 2022-11-04 MED ORDER — ONDANSETRON 4 MG PO TBDP
4.0000 mg | ORAL_TABLET | Freq: Once | ORAL | Status: AC
  Administered 2022-11-04: 4 mg via ORAL
  Filled 2022-11-04: qty 1

## 2022-11-04 MED ORDER — NAPROXEN 500 MG PO TABS: 500.0000 mg | ORAL_TABLET | Freq: Two times a day (BID) | ORAL | 0 refills | Status: DC | PRN

## 2022-11-04 MED ORDER — DIPHENHYDRAMINE HCL 50 MG/ML IJ SOLN
25.0000 mg | Freq: Once | INTRAMUSCULAR | Status: AC
  Administered 2022-11-04: 25 mg via INTRAMUSCULAR
  Filled 2022-11-04: qty 1

## 2022-11-04 MED ORDER — METOCLOPRAMIDE HCL 5 MG/ML IJ SOLN
10.0000 mg | Freq: Once | INTRAMUSCULAR | Status: AC
  Administered 2022-11-04: 10 mg via INTRAMUSCULAR
  Filled 2022-11-04: qty 2

## 2022-11-04 MED ORDER — KETOROLAC TROMETHAMINE 30 MG/ML IJ SOLN
30.0000 mg | Freq: Once | INTRAMUSCULAR | Status: AC
  Administered 2022-11-04: 30 mg via INTRAMUSCULAR
  Filled 2022-11-04: qty 1

## 2022-11-04 NOTE — Discharge Instructions (Addendum)
CT head is normal.  You likely have a concussion.  Take the anti-inflammatories as prescribed and keep yourself hydrated.  Follow-up with the neurologist.  Return to the ED with new or worsening symptoms.

## 2022-11-04 NOTE — ED Provider Notes (Signed)
[place] EMERGENCY DEPARTMENT AT [facility] Provider Note   CSN: [id number] Arrival date & time: 11/03/22  2313     History  Chief Complaint  Patient presents with   Head Injury    Terry Dunn is a 37 y.o. male.  Patient with concern for head injury.  States he was walking in the dark 2 nights ago and struck his forehead on a door frame.  Hit forehead but did not fall to the ground or lose consciousness.  Has had a "migraine" since.  Headache is diffuse not improved with his home pain medications which include Robaxin and oxycodone.  He has had nausea but no vomiting.  Does have photophobia.  No blurry vision or double vision.  No chest pain or shortness of breath.  No focal weakness, numbness or tingling.  Difficulty speaking or difficulty swallowing.  No midline neck or back pain.  No fever. Did have a concussion many years ago and this feels similar. Denies any room spinning dizziness or lightheadedness.  Patient reports no headache prior to striking his head.  Headache is progressively worsened.  No visual changes.  No chest pain or shortness of breath.  No focal weakness, numbness or tingling.  No bowel or bladder incontinence.  The history is provided by the patient.  Head Injury Associated symptoms: headache and nausea   Associated symptoms: no vomiting        Home Medications Prior to Admission medications   Medication Sig Start Date End Date Taking? Authorizing Provider  bisacodyl (DULCOLAX) 5 MG EC tablet Take 1 tablet (5 mg total) by mouth daily as needed for moderate constipation. Patient not taking: Reported on 04/22/2020 01/28/20   McDonald, Pedro Earls A, PA-C  cyclobenzaprine (FLEXERIL) 10 MG tablet Take 1 tablet (10 mg total) by mouth 2 (two) times daily as needed for muscle spasms. Patient not taking: Reported on 04/22/2020 12/09/19   Milagros Loll, MD  docusate sodium (COLACE) 100 MG capsule Take 1 capsule (100 mg total) by mouth every 12 (twelve)  hours. 08/18/21   Lorre Nick, MD  etodolac (LODINE) 300 MG capsule Take 1 capsule (300 mg total) by mouth every 8 (eight) hours. Patient not taking: Reported on 04/22/2020 11/23/19   Linwood Dibbles, MD  methocarbamol (ROBAXIN) 500 MG tablet Take 1 tablet (500 mg total) by mouth 2 (two) times daily. 08/18/21   Lorre Nick, MD  ondansetron (ZOFRAN ODT) 4 MG disintegrating tablet Take 1 tablet (4 mg total) by mouth every 8 (eight) hours as needed for nausea or vomiting. 04/22/20   Khatri, Hina, PA-C  oxyCODONE-acetaminophen (PERCOCET/ROXICET) 5-325 MG tablet Take 1 tablet by mouth every 4 (four) hours as needed for severe pain. 05/26/20   Elpidio Anis, PA-C  oxyCODONE-acetaminophen (PERCOCET/ROXICET) 5-325 MG tablet Take 1-2 tablets by mouth every 6 (six) hours as needed for severe pain. 08/18/21   Lorre Nick, MD  predniSONE (DELTASONE) 50 MG tablet Take 1 tablet (50 mg total) by mouth daily. Patient not taking: No sig reported 11/23/19   Linwood Dibbles, MD  predniSONE (STERAPRED UNI-PAK 21 TAB) 10 MG (21) TBPK tablet Take by mouth daily. Take 6 tabs by mouth daily  for 1 day, then 5 tabs for 1days, then 4 tabs for 1 days, then 3 tabs for 1 days, 2 tabs for 1 days, then 1 tab by mouth daily for 1 days Patient not taking: No sig reported 12/09/19   Milagros Loll, MD      Allergies  Peanut-containing drug products and Contrast media [iodinated contrast media]    Review of Systems   Review of Systems  Constitutional:  Negative for activity change, appetite change, fatigue and fever.  HENT:  Negative for congestion and rhinorrhea.   Eyes:  Positive for photophobia.  Respiratory:  Negative for cough, chest tightness and shortness of breath.   Cardiovascular:  Negative for chest pain.  Gastrointestinal:  Positive for nausea. Negative for vomiting.  Genitourinary:  Negative for dysuria and urgency.  Musculoskeletal:  Negative for arthralgias and myalgias.  Skin:  Negative for rash.  Neurological:   Positive for headaches. Negative for weakness.   all other systems are negative except as noted in the HPI and PMH.    Physical Exam Updated Vital Signs BP (!) 145/98 (BP Location: Left Arm)   Pulse (!) 108   Temp 98.7 F (37.1 C) (Oral)   Resp 18   Ht 6' (1.829 m)   Wt 105.1 kg   SpO2 100%   BMI 31.42 kg/m  Physical Exam Vitals and nursing note reviewed.  Constitutional:      General: He is not in acute distress.    Appearance: He is well-developed.  HENT:     Head: Normocephalic and atraumatic.     Comments: Wearing sunglasses    Mouth/Throat:     Pharynx: No oropharyngeal exudate.  Eyes:     Conjunctiva/sclera: Conjunctivae normal.     Pupils: Pupils are equal, round, and reactive to light.  Neck:     Comments: No midline C-spine tenderness Cardiovascular:     Rate and Rhythm: Normal rate and regular rhythm.     Heart sounds: Normal heart sounds. No murmur heard. Pulmonary:     Effort: Pulmonary effort is normal. No respiratory distress.     Breath sounds: Normal breath sounds.  Abdominal:     Palpations: Abdomen is soft.     Tenderness: There is no abdominal tenderness. There is no guarding or rebound.  Musculoskeletal:        General: No tenderness. Normal range of motion.     Cervical back: Normal range of motion and neck supple.  Skin:    General: Skin is warm.  Neurological:     Mental Status: He is alert and oriented to person, place, and time.     Cranial Nerves: No cranial nerve deficit.     Motor: No abnormal muscle tone.     Coordination: Coordination normal.     Comments: CN 2-12 intact, no ataxia on finger to nose, no nystagmus, 5/5 strength throughout, no pronator drift, Romberg negative, normal gait.   Psychiatric:        Behavior: Behavior normal.     ED Results / Procedures / Treatments   Labs (all labs ordered are listed, but only abnormal results are displayed) Labs Reviewed - No data to display  EKG None  Radiology CT Head Wo  Contrast  Result Date: 11/04/2022 CLINICAL DATA:  Head trauma from running into a door. Hit forehead. Subsequent headache and nausea. EXAM: CT HEAD WITHOUT CONTRAST TECHNIQUE: Contiguous axial images were obtained from the base of the skull through the vertex without intravenous contrast. RADIATION DOSE REDUCTION: This exam was performed according to the departmental dose-optimization program which includes automated exposure control, adjustment of the mA and/or kV according to patient size and/or use of iterative reconstruction technique. COMPARISON:  None Available. FINDINGS: Brain: No intracranial hemorrhage, mass effect, or evidence of acute infarct. No hydrocephalus. No extra-axial fluid collection.  Vascular: No hyperdense vessel or unexpected calcification. Skull: No fracture or focal lesion. Sinuses/Orbits: No acute finding. Other: None. IMPRESSION: No acute intracranial abnormality. Electronically Signed   By: Minerva Fester M.D.   On: 11/04/2022 00:23    Procedures Procedures    Medications Ordered in ED Medications  ketorolac (TORADOL) 30 MG/ML injection 30 mg (has no administration in time range)  ondansetron (ZOFRAN-ODT) disintegrating tablet 4 mg (has no administration in time range)  metoCLOPramide (REGLAN) injection 10 mg (has no administration in time range)  diphenhydrAMINE (BENADRYL) injection 25 mg (has no administration in time range)    ED Course/ Medical Decision Making/ A&P                                 Medical Decision Making Amount and/or Complexity of Data Reviewed Independent Historian: spouse Labs: ordered. Decision-making details documented in ED Course. Radiology: ordered and independent interpretation performed. Decision-making details documented in ED Course. ECG/medicine tests: ordered and independent interpretation performed. Decision-making details documented in ED Course.  Risk Prescription drug management.   Head injury x 2 days associate with  photophobia and nausea.  Neurological exam is nonfocal.  No midline C-spine pain.  Suspect likely concussion.  Will give dose of IM Toradol, Reglan, Benadryl and Zofran.  CT head is negative for hemorrhage or fracture.  Results reviewed and interpreted by me  Patient feels improved after medications in the ED.  Tolerating p.o. and ambulatory.  Neurological exam is nonfocal.  Suspect concussion.  Will treat supportively with anti-inflammatories and oral hydration at home.  Follow-up with neurology.  Return precautions discussed.        Final Clinical Impression(s) / ED Diagnoses Final diagnoses:  None    Rx / DC Orders ED Discharge Orders     None         Rudi Bunyard, Jeannett Senior, MD 11/04/22 [phone]

## 2023-04-09 ENCOUNTER — Emergency Department (HOSPITAL_COMMUNITY)
Admission: EM | Admit: 2023-04-09 | Discharge: 2023-04-10 | Disposition: A | Discharge status: Home / Self Care | Payer: No Typology Code available for payment source | Attending: Emergency Medicine | Admitting: Emergency Medicine

## 2023-04-09 ENCOUNTER — Emergency Department (HOSPITAL_COMMUNITY): Payer: No Typology Code available for payment source

## 2023-04-09 ENCOUNTER — Other Ambulatory Visit: Payer: Self-pay

## 2023-04-09 DIAGNOSIS — Z91010 Allergy to peanuts: Secondary | ICD-10-CM | POA: Diagnosis not present

## 2023-04-09 DIAGNOSIS — R110 Nausea: Secondary | ICD-10-CM | POA: Insufficient documentation

## 2023-04-09 DIAGNOSIS — R1011 Right upper quadrant pain: Secondary | ICD-10-CM | POA: Diagnosis present

## 2023-04-09 LAB — COMPREHENSIVE METABOLIC PANEL
ALT: 25 U/L (ref 0–44)
AST: 17 U/L (ref 15–41)
Albumin: 4.2 g/dL (ref 3.5–5.0)
Alkaline Phosphatase: 37 U/L — ABNORMAL LOW (ref 38–126)
Anion gap: 8 (ref 5–15)
BUN: 18 mg/dL (ref 6–20)
CO2: 24 mmol/L (ref 22–32)
Calcium: 9.1 mg/dL (ref 8.9–10.3)
Chloride: 106 mmol/L (ref 98–111)
Creatinine, Ser: 1.08 mg/dL (ref 0.61–1.24)
GFR, Estimated: >60 mL/min (ref >60)
Glucose, Bld: 104 mg/dL — ABNORMAL HIGH (ref 70–99)
Potassium: 3.6 mmol/L (ref 3.5–5.1)
Sodium: 138 mmol/L (ref 135–145)
Total Bilirubin: 0.8 mg/dL (ref 0.0–1.2)
Total Protein: 7.2 g/dL (ref 6.5–8.1)

## 2023-04-09 LAB — CBC WITH DIFFERENTIAL/PLATELET

## 2023-04-09 LAB — URINALYSIS, ROUTINE W REFLEX MICROSCOPIC
Bacteria, UA: NONE SEEN
Bilirubin Urine: NEGATIVE
Glucose, UA: NEGATIVE mg/dL
Hgb urine dipstick: NEGATIVE
Ketones, ur: NEGATIVE mg/dL
Nitrite: NEGATIVE
Protein, ur: NEGATIVE mg/dL
Specific Gravity, Urine: 1.009 (ref 1.005–1.030)
pH: 6 (ref 5.0–8.0)

## 2023-04-09 LAB — LIPASE, BLOOD: Lipase: 36 U/L (ref 11–51)

## 2023-04-10 ENCOUNTER — Emergency Department (HOSPITAL_COMMUNITY): Payer: No Typology Code available for payment source

## 2023-04-10 LAB — CBC WITH DIFFERENTIAL/PLATELET
Basophils Relative: 0.1 10*3/uL (ref 0.0–0.1)
Eosinophils Absolute: 1 10*3/uL (ref 0.0–0.5)
Eosinophils Relative: 0.4 10*3/uL (ref 0.0–0.5)
HCT: 40.9 % (ref 39.0–52.0)
Hemoglobin: 13.9 g/dL (ref 13.0–17.0)
Lymphocytes Relative: 53 %
Lymphs Abs: 4.1 10*3/uL — ABNORMAL HIGH (ref 0.7–4.0)
MCH: 29.8 pg (ref 26.0–34.0)
MCHC: 34 g/dL (ref 30.0–36.0)
MCV: 87.6 fL (ref 80.0–100.0)
Monocytes Absolute: 6 10*3/uL (ref 0.1–1.0)
Monocytes Relative: 0.5 10*3/uL (ref 0.1–1.0)
Monocytes Relative: 7 10*3/uL (ref 0.7–4.0)
Neutro Abs: 2.6 10*3/uL (ref 1.7–7.7)
Neutrophils Relative %: 33 %
Other: 0.01 10*3/uL (ref 0.00–0.07)
Platelets: 280 10*3/uL (ref 150–400)
RBC: 4.67 MIL/uL (ref 4.22–5.81)
RDW: 13 % (ref 11.5–15.5)
Smear Review: 0 %
WBC: 7.7 10*3/uL (ref 4.0–10.5)
nRBC: 0 % (ref 0.0–0.2)

## 2023-04-10 MED ORDER — ONDANSETRON HCL 4 MG/2ML IJ SOLN
4.0000 mg | Freq: Once | INTRAMUSCULAR | Status: AC
  Administered 2023-04-10: 4 mg via INTRAVENOUS
  Filled 2023-04-10: qty 2

## 2023-04-10 MED ORDER — MORPHINE SULFATE (PF) 4 MG/ML IV SOLN
4.0000 mg | Freq: Once | INTRAVENOUS | Status: AC
  Administered 2023-04-10: 4 mg via INTRAVENOUS
  Filled 2023-04-10: qty 1

## 2023-04-10 MED ORDER — HYDROCODONE-ACETAMINOPHEN 5-325 MG PO TABS
1.0000 | ORAL_TABLET | Freq: Four times a day (QID) | ORAL | 0 refills | Status: DC | PRN
Start: 2023-04-10 — End: 2023-10-21

## 2023-04-10 MED ORDER — ONDANSETRON 4 MG PO TBDP: 4.0000 mg | ORAL_TABLET | Freq: Four times a day (QID) | ORAL | 0 refills | Status: DC | PRN

## 2023-04-10 NOTE — ED Provider Notes (Signed)
[facility] EMERGENCY DEPARTMENT AT [facility] Provider Note   CSN: [id number] Arrival date & time: 04/09/23  2254     History  Chief Complaint  Patient presents with   Abdominal Pain    Patient c/o upper right abdominal pain for about 1.5 weeks. Nausea reported but no vomiting. No other complaints at this time. Pain 7/10     Terry Dunn is a 38 y.o. male with overall noncontributory past medical history who presents with concern for right upper quadrant abdominal pain intermittently for the last 1-1/2 to 2 weeks.  He reports some nausea but no vomiting.  He reports pain worse after eating.  He rates pain 7/10 at this time.  No previous history of stomach surgery.  He denies any previous history of gallbladder pathology.  He denies alcohol use.   Abdominal Pain      Home Medications Prior to Admission medications   Medication Sig Start Date End Date Taking? Authorizing Provider  HYDROcodone-acetaminophen (NORCO/VICODIN) 5-325 MG tablet Take 1 tablet by mouth every 6 (six) hours as needed. 04/10/23  Yes Myrle Dues H, PA-C  ondansetron (ZOFRAN-ODT) 4 MG disintegrating tablet Take 1 tablet (4 mg total) by mouth every 6 (six) hours as needed for nausea or vomiting. 04/10/23  Yes Dayanira Giovannetti H, PA-C  bisacodyl (DULCOLAX) 5 MG EC tablet Take 1 tablet (5 mg total) by mouth daily as needed for moderate constipation. Patient not taking: Reported on 04/22/2020 01/28/20   McDonald, Pedro Earls A, PA-C  cyclobenzaprine (FLEXERIL) 10 MG tablet Take 1 tablet (10 mg total) by mouth 2 (two) times daily as needed for muscle spasms. Patient not taking: Reported on 04/22/2020 12/09/19   Milagros Loll, MD  docusate sodium (COLACE) 100 MG capsule Take 1 capsule (100 mg total) by mouth every 12 (twelve) hours. Patient not taking: Reported on 11/04/2022 08/18/21   Lorre Nick, MD  etodolac (LODINE) 300 MG capsule Take 1 capsule (300 mg total) by mouth every 8 (eight)  hours. Patient not taking: Reported on 04/22/2020 11/23/19   Linwood Dibbles, MD  gabapentin (NEURONTIN) 300 MG capsule Take 300 mg by mouth 3 (three) times daily.    [provider]  methocarbamol (ROBAXIN) 500 MG tablet Take 1 tablet (500 mg total) by mouth 2 (two) times daily. Patient not taking: Reported on 11/04/2022 08/18/21   Lorre Nick, MD  methocarbamol (ROBAXIN) 750 MG tablet Take 750 mg by mouth 2 (two) times daily.    [provider]  naproxen (NAPROSYN) 500 MG tablet Take 1 tablet (500 mg total) by mouth 2 (two) times daily as needed for moderate pain. 11/04/22   Rancour, Jeannett Senior, MD  Oxycodone HCl 10 MG TABS Take 1 tablet by mouth 4 (four) times daily.    [provider]  oxyCODONE-acetaminophen (PERCOCET/ROXICET) 5-325 MG tablet Take 1 tablet by mouth every 4 (four) hours as needed for severe pain. Patient not taking: Reported on 11/04/2022 05/26/20   Elpidio Anis, PA-C  oxyCODONE-acetaminophen (PERCOCET/ROXICET) 5-325 MG tablet Take 1-2 tablets by mouth every 6 (six) hours as needed for severe pain. Patient not taking: Reported on 11/04/2022 08/18/21   Lorre Nick, MD  predniSONE (DELTASONE) 50 MG tablet Take 1 tablet (50 mg total) by mouth daily. Patient not taking: Reported on 03/17/2020 11/23/19   Linwood Dibbles, MD  predniSONE (STERAPRED UNI-PAK 21 TAB) 10 MG (21) TBPK tablet Take by mouth daily. Take 6 tabs by mouth daily  for 1 day, then 5 tabs for 1days, then  4 tabs for 1 days, then 3 tabs for 1 days, 2 tabs for 1 days, then 1 tab by mouth daily for 1 days Patient not taking: Reported on 03/17/2020 12/09/19   Milagros Loll, MD      Allergies    Other, Peanut-containing drug products, and Contrast media [iodinated contrast media]    Review of Systems   Review of Systems  Gastrointestinal:  Positive for abdominal pain.  All other systems reviewed and are negative.   Physical Exam Updated Vital Signs BP (!) 142/87   Pulse 68   Temp 97.8 F (36.6  C) (Oral)   Resp 14   SpO2 99%  Physical Exam Vitals and nursing note reviewed.  Constitutional:      General: He is not in acute distress.    Appearance: Normal appearance.  HENT:     Head: Normocephalic and atraumatic.  Eyes:     General:        Right eye: No discharge.        Left eye: No discharge.  Cardiovascular:     Rate and Rhythm: Normal rate and regular rhythm.     Heart sounds: No murmur heard.    No friction rub. No gallop.  Pulmonary:     Effort: Pulmonary effort is normal.     Breath sounds: Normal breath sounds.  Abdominal:     General: Bowel sounds are normal.     Palpations: Abdomen is soft.     Comments: Focal tenderness in the right upper quadrant, some guarding, positive Murphy sign, no rigidity.  Skin:    General: Skin is warm and dry.     Capillary Refill: Capillary refill takes less than 2 seconds.  Neurological:     Mental Status: He is alert and oriented to person, place, and time.  Psychiatric:        Mood and Affect: Mood normal.        Behavior: Behavior normal.     ED Results / Procedures / Treatments   Labs (all labs ordered are listed, but only abnormal results are displayed) Labs Reviewed  COMPREHENSIVE METABOLIC PANEL - Abnormal; Notable for the following components:      Result Value   Glucose, Bld 104 (*)    Alkaline Phosphatase 37 (*)    All other components within normal limits  CBC WITH DIFFERENTIAL/PLATELET - Abnormal; Notable for the following components:   Lymphs Abs 4.1 (*)    All other components within normal limits  URINALYSIS, ROUTINE W REFLEX MICROSCOPIC - Abnormal; Notable for the following components:   Color, Urine STRAW (*)    Leukocytes,Ua TRACE (*)    All other components within normal limits  LIPASE, BLOOD  PATHOLOGIST SMEAR REVIEW    EKG None  Radiology CT ABDOMEN PELVIS WO CONTRAST Result Date: 04/10/2023 CLINICAL DATA:  Acute nonlocalized abdominal pain EXAM: CT ABDOMEN AND PELVIS WITHOUT CONTRAST  TECHNIQUE: Multidetector CT imaging of the abdomen and pelvis was performed following the standard protocol without IV contrast. RADIATION DOSE REDUCTION: This exam was performed according to the departmental dose-optimization program which includes automated exposure control, adjustment of the mA and/or kV according to patient size and/or use of iterative reconstruction technique. COMPARISON:  None Available. FINDINGS: Lower chest: No acute abnormality. Hepatobiliary: No focal liver abnormality is seen. No gallstones, gallbladder wall thickening, or biliary dilatation. Pancreas: Unremarkable Spleen: Unremarkable Adrenals/Urinary Tract: Adrenal glands are unremarkable. Kidneys are normal, without renal calculi, focal lesion, or hydronephrosis. Bladder is unremarkable. Stomach/Bowel:  Stomach is within normal limits. Appendix appears normal. No evidence of bowel wall thickening, distention, or inflammatory changes. Vascular/Lymphatic: No significant vascular findings are present. No enlarged abdominal or pelvic lymph nodes. Reproductive: Prostate is unremarkable. Other: Ventral hernia repair with mesh has been performed. No recurrent abdominal wall hernia. No abdominopelvic ascites. Musculoskeletal: L5-S1 prosthetic disc implant noted. No acute bone abnormality. No lytic or blastic bone lesion. IMPRESSION: 1. No acute intra-abdominal pathology identified. No definite radiographic explanation for the patient's reported symptoms. 2. Ventral hernia repair with mesh. No recurrent abdominal wall hernia. Electronically Signed   By: Helyn Numbers M.D.   On: 04/10/2023 01:18   US Abdomen Limited RUQ (LIVER/GB) Result Date: 04/10/2023 CLINICAL DATA:  Right upper quadrant pain EXAM: ULTRASOUND ABDOMEN LIMITED RIGHT UPPER QUADRANT COMPARISON:  CT 08/18/2021 FINDINGS: Gallbladder: No gallstones or wall thickening visualized. No sonographic Murphy sign noted by sonographer. Common bile duct: Diameter: 2.6 mm Liver: No focal  lesion identified. Within normal limits in parenchymal echogenicity. Portal vein is patent on color Doppler imaging with normal direction of blood flow towards the liver. Other: None. IMPRESSION: Negative examination. Electronically Signed   By: Jasmine Pang M.D.   On: 04/10/2023 00:03    Procedures Procedures    Medications Ordered in ED Medications  ondansetron (ZOFRAN) injection 4 mg (4 mg Intravenous Given 04/10/23 0033)  morphine (PF) 4 MG/ML injection 4 mg (4 mg Intravenous Given 04/10/23 0034)    ED Course/ Medical Decision Making/ A&P                                 Medical Decision Making Amount and/or Complexity of Data Reviewed Labs: ordered. Radiology: ordered.  Risk Prescription drug management.   This patient is a 38 y.o. male who presents to the ED for concern of ruq abdominal pain.   Differential diagnoses prior to evaluation: The causes of generalized abdominal pain include but are not limited to AAA, mesenteric ischemia, appendicitis, diverticulitis, DKA, gastritis, gastroenteritis, AMI, nephrolithiasis, pancreatitis, peritonitis, adrenal insufficiency,lead poisoning, iron toxicity, intestinal ischemia, constipation, UTI,SBO/LBO, splenic rupture, biliary disease, IBD, IBS, PUD, or hepatitis --suspect some biliary pathology given his focal right upper quadrant tenderness on exam, and postprandial symptoms although peptic ulcer disease  Past Medical History / Social History / Additional history: Chart reviewed. Pertinent results include: Noncontributory  Physical Exam: Physical exam performed. The pertinent findings include: Fairly hypertensive on arrival, blood pressure 177/115, I think likely secondary to pain, focal right upper quadrant tenderness, positive Murphy sign.  Labs/imaging: CMP unremarkable, no significant abnormalities, liver enzymes normal.  CBC overall unremarkable other than questionable smudge cells noted, pathologist reviewing smear, low  clinical suspicion for AML or other acute blood cell disorder given otherwise normal CBC.  Lipase unremarkable, UA unremarkable.  I independently interpreted right upper quadrant ultrasound, CT abdomen pelvis which showed no evidence of acute intra-abdominal abnormality to explain patient's symptoms.  Nonetheless suspect some subacute gallbladder pathology versus ulcer, encourage close GI follow-up.  Medications / Treatment: Will discharge with Zofran, short course of pain medicine, gallbladder eating plan, and close follow-up with GI/surgery as needed   Disposition: After consideration of the diagnostic results and the patients response to treatment, I feel that patient stable for discharge with plan as above, pain controlled in ED.   emergency department workup does not suggest an emergent condition requiring admission or immediate intervention beyond what has been performed at this time. The plan  is: as above. The patient is safe for discharge and has been instructed to return immediately for worsening symptoms, change in symptoms or any other concerns.  Final Clinical Impression(s) / ED Diagnoses Final diagnoses:  RUQ abdominal pain    Rx / DC Orders ED Discharge Orders          Ordered    ondansetron (ZOFRAN-ODT) 4 MG disintegrating tablet  Every 6 hours PRN        04/10/23 0304    HYDROcodone-acetaminophen (NORCO/VICODIN) 5-325 MG tablet  Every 6 hours PRN        04/10/23 0304              Nam Vossler, Harrel Carina, PA-C 04/10/23 0308    Shon Baton, MD 04/10/23 [phone]

## 2023-04-10 NOTE — Discharge Instructions (Signed)
Please use Tylenol or ibuprofen for pain.  You may use 600 mg ibuprofen every 6 hours or 1000 mg of Tylenol every 6 hours.  You may choose to alternate between the 2.  This would be most effective.  Not to exceed 4 g of Tylenol within 24 hours.  Not to exceed 3200 mg ibuprofen 24 hours.  You can use the stronger narcotic pain medication in place of Tylenol for severe break through pain.  If you take the narcotic pain medication that we prescribed recommend that you also take a laxative such as MiraLAX or Dulcolax every day that you take the narcotic pain medicine, and drink plenty of fluids, 50 to 64 ounces to prevent any constipation.  You can use the nausea medication up to every 6 hours as needed.  Please contact the GI doctor, general surgeon to discuss your abdominal pain, return to the emergency department if you have significant worsening abdominal pain despite treatment as above.

## 2023-04-11 LAB — PATHOLOGIST SMEAR REVIEW

## 2023-10-20 ENCOUNTER — Emergency Department (HOSPITAL_COMMUNITY)
Admission: EM | Admit: 2023-10-20 | Discharge: 2023-10-21 | Disposition: A | Discharge status: Home / Self Care | Attending: Emergency Medicine | Admitting: Emergency Medicine

## 2023-10-20 ENCOUNTER — Other Ambulatory Visit: Payer: Self-pay

## 2023-10-20 ENCOUNTER — Encounter (HOSPITAL_COMMUNITY): Payer: Self-pay | Admitting: Emergency Medicine

## 2023-10-20 DIAGNOSIS — M545 Low back pain, unspecified: Secondary | ICD-10-CM | POA: Diagnosis present

## 2023-10-20 DIAGNOSIS — M5441 Lumbago with sciatica, right side: Secondary | ICD-10-CM | POA: Diagnosis not present

## 2023-10-20 DIAGNOSIS — M5442 Lumbago with sciatica, left side: Secondary | ICD-10-CM | POA: Diagnosis not present

## 2023-10-20 MED ORDER — HYDROMORPHONE HCL 1 MG/ML IJ SOLN
1.0000 mg | Freq: Once | INTRAMUSCULAR | Status: AC
  Administered 2023-10-20: 1 mg via INTRAVENOUS
  Filled 2023-10-20: qty 1

## 2023-10-20 NOTE — Discharge Instructions (Signed)
 We are transferring you to [facility] emergency department for MRI.  Please go straight to the emergency department.

## 2023-10-20 NOTE — ED Triage Notes (Signed)
 Patient presents due to hip pain, tail bone pain, radiculopathy and numbness in lower extremities. Symptoms started Friday. He has had previous episodes . Nothing he has done in the past has help to stop the pain this time.

## 2023-10-20 NOTE — ED Provider Notes (Signed)
 [facility]-EMERGENCY DEPT [facility] Emergency Department Provider Note MRN:  [id number]  Arrival date & time: 10/20/23     Chief Complaint   Hip Pain   History of Present Illness   Terry Dunn is a 38 y.o. year-old male with a history of degenerative disc disease presenting to the ED with chief complaint of hip pain.  Low back pain bilateral hip pain with pain radiating down the legs increased over the past few days.  Having some numbness to bilateral legs as well.  Denies bowel or bladder dysfunction.  Review of Systems  A thorough review of systems was obtained and all systems are negative except as noted in the HPI and PMH.   Patient's Health History   History reviewed. No pertinent past medical history.  Past Surgical History:  Procedure Laterality Date   BACK SURGERY     HERNIA REPAIR      Family History  Problem Relation Age of Onset   Healthy Mother    Healthy Father     Social History   Socioeconomic History   Marital status: Married    Spouse name: Not on file   Number of children: Not on file   Years of education: Not on file   Highest education level: Not on file  Occupational History   Not on file  Tobacco Use   Smoking status: Never   Smokeless tobacco: Never  Vaping Use   Vaping status: Never Used  Substance and Sexual Activity   Alcohol use: Yes    Comment: Social   Drug use: Never   Sexual activity: Yes    Birth control/protection: None    Comment: Married  Other Topics Concern   Not on file  Social History Narrative   Not on file   Social Drivers of Health   Financial Resource Strain: Not on file  Food Insecurity: Not on file  Transportation Needs: Not on file  Physical Activity: Not on file  Stress: Not on file  Social Connections: Not on file  Intimate Partner Violence: Not on file     Physical Exam   Vitals:   10/20/23 2129  BP: (!) 149/82  Pulse: (!) 114  Resp: 18  Temp: 98.3 F (36.8 C)  SpO2: 95%     CONSTITUTIONAL: Well-appearing, moderate distress due to pain NEURO/PSYCH:  Alert and oriented x 3, no focal deficits EYES:  eyes equal and reactive ENT/NECK:  no LAD, no JVD CARDIO: Tachycardic rate, well-perfused, normal S1 and S2 PULM:  CTAB no wheezing or rhonchi GI/GU:  non-distended, non-tender MSK/SPINE:  No gross deformities, no edema SKIN:  no rash, atraumatic   *Additional and/or pertinent findings included in MDM below  Diagnostic and Interventional Summary    EKG Interpretation Date/Time:    Ventricular Rate:    PR Interval:    QRS Duration:    QT Interval:    QTC Calculation:   R Axis:      Text Interpretation:         Labs Reviewed  CBC  COMPREHENSIVE METABOLIC PANEL WITH GFR    MR LUMBAR SPINE WO CONTRAST    (Results Pending)    Medications  HYDROmorphone  (DILAUDID ) injection 1 mg (has no administration in time range)     Procedures  /  Critical Care Procedures  ED Course and Medical Decision Making  Initial Impression and Ddx Long history of radicular back pain, having acute worsening and endorsing leg numbness bilaterally.  This raises concern for possible myelopathy.  Will need MRI to further evaluate.  Past medical/surgical history that increases complexity of ED encounter: Degenerative disc disease  Interpretation of Diagnostics Labs pending  Patient Reassessment and Ultimate Disposition/Management     Dr. Raford accepts patient for transfer to the [facility] emergency department for MRI.  Patient management required discussion with the following services or consulting groups:  None  Complexity of Problems Addressed Acute illness or injury that poses threat of life of bodily function  Additional Data Reviewed and Analyzed Further history obtained from: Further history from spouse/family member  Additional Factors Impacting ED Encounter Risk Consideration of hospitalization  [name], MD [facility]  Medicine [facility] [email or web address]  Final Clinical Impressions(s) / ED Diagnoses     ICD-10-CM   1. Acute midline low back pain with bilateral sciatica  M54.42    M54.41       ED Discharge Orders     None        Discharge Instructions Discussed with and Provided to Patient:     Discharge Instructions      We are transferring you to [facility] emergency department for MRI.  Please go straight to the emergency department.       Theadore Ozell HERO, MD 10/20/23 [phone]

## 2023-10-21 ENCOUNTER — Encounter (HOSPITAL_COMMUNITY): Payer: Self-pay

## 2023-10-21 ENCOUNTER — Emergency Department (HOSPITAL_COMMUNITY)

## 2023-10-21 LAB — CBC
HCT: 43.5 % (ref 39.0–52.0)
Hemoglobin: 14.5 g/dL (ref 13.0–17.0)
MCH: 29.2 pg (ref 26.0–34.0)
MCHC: 33.3 g/dL (ref 30.0–36.0)
MCV: 87.5 fL (ref 80.0–100.0)
Platelets: 214 K/uL (ref 150–400)
RBC: 4.97 MIL/uL (ref 4.22–5.81)
RDW: 13.6 % (ref 11.5–15.5)
WBC: 6.1 K/uL (ref 4.0–10.5)
nRBC: 0 % (ref 0.0–0.2)

## 2023-10-21 LAB — COMPREHENSIVE METABOLIC PANEL WITH GFR
ALT: 16 U/L (ref 0–44)
AST: 16 U/L (ref 15–41)
Albumin: 4.4 g/dL (ref 3.5–5.0)
Alkaline Phosphatase: 49 U/L (ref 38–126)
Anion gap: 12 (ref 5–15)
BUN: 11 mg/dL (ref 6–20)
CO2: 22 mmol/L (ref 22–32)
Calcium: 9.7 mg/dL (ref 8.9–10.3)
Chloride: 103 mmol/L (ref 98–111)
Creatinine, Ser: 1.02 mg/dL (ref 0.61–1.24)
GFR, Estimated: >60 mL/min (ref >60)
Glucose, Bld: 105 mg/dL — ABNORMAL HIGH (ref 70–99)
Potassium: 4.2 mmol/L (ref 3.5–5.1)
Sodium: 138 mmol/L (ref 135–145)
Total Bilirubin: 0.4 mg/dL (ref 0.0–1.2)
Total Protein: 6.8 g/dL (ref 6.5–8.1)

## 2023-10-21 MED ORDER — HYDROMORPHONE HCL 1 MG/ML IJ SOLN
1.0000 mg | Freq: Once | INTRAMUSCULAR | Status: AC
  Administered 2023-10-21: 1 mg via INTRAVENOUS
  Filled 2023-10-21: qty 1

## 2023-10-21 MED ORDER — METHYLPREDNISOLONE 4 MG PO TBPK: ORAL_TABLET | ORAL | 0 refills | Status: AC

## 2023-10-21 NOTE — ED Notes (Signed)
 Family asking about MRI status. MRI called for status update; was told they are working down the list.

## 2023-10-21 NOTE — ED Notes (Signed)
 Pt transported to MRI

## 2023-10-21 NOTE — ED Notes (Signed)
 Pt left after speaking to Dr Theadore - no VS obtained.

## 2023-10-21 NOTE — ED Provider Notes (Signed)
 Patient presents to the emergency department for evaluation of lower back pain.  Patient seen initially at [facility] and transferred here for MRI.  Patient with previous lumbar surgery.  MRI has been performed.  Previous surgery site widely patent without problems.  The rest of his lumbar spine is essentially normal.  Will treat for inflammatory condition.   Haze Lonni PARAS, MD 10/21/23 [phone]

## 2024-02-25 ENCOUNTER — Other Ambulatory Visit: Payer: Self-pay

## 2024-02-25 ENCOUNTER — Encounter (HOSPITAL_COMMUNITY): Payer: Self-pay

## 2024-02-25 ENCOUNTER — Emergency Department (HOSPITAL_COMMUNITY)
Admission: EM | Admit: 2024-02-25 | Discharge: 2024-02-26 | Discharge status: Against Medical Advice | Attending: Emergency Medicine | Admitting: Emergency Medicine

## 2024-02-25 DIAGNOSIS — G8929 Other chronic pain: Secondary | ICD-10-CM | POA: Diagnosis not present

## 2024-02-25 DIAGNOSIS — Z5321 Procedure and treatment not carried out due to patient leaving prior to being seen by health care provider: Secondary | ICD-10-CM | POA: Diagnosis not present

## 2024-02-25 DIAGNOSIS — M79605 Pain in left leg: Secondary | ICD-10-CM | POA: Diagnosis not present

## 2024-02-25 DIAGNOSIS — M25559 Pain in unspecified hip: Secondary | ICD-10-CM | POA: Insufficient documentation

## 2024-02-25 DIAGNOSIS — M79604 Pain in right leg: Secondary | ICD-10-CM | POA: Diagnosis not present

## 2024-02-25 DIAGNOSIS — M549 Dorsalgia, unspecified: Secondary | ICD-10-CM | POA: Diagnosis present

## 2024-02-25 NOTE — ED Triage Notes (Signed)
 Pt reports with chronic back pain, hip pain, and bilateral leg pain that worsened today, has been going on for months. Pt ambulatory to triage.

## 2024-02-26 NOTE — ED Notes (Signed)
 Patient was observed leaving the ER through front door by security.
